# Patient Record
Sex: Female | Born: 2000 | Race: Black or African American | Hispanic: No | Marital: Single | Smoking: Never smoker
Health system: Southern US, Community
[De-identification: ages and names within clinical notes are randomized; demographics above are authoritative.]

## PROBLEM LIST (undated history)

## (undated) DIAGNOSIS — J45909 Unspecified asthma, uncomplicated: Secondary | ICD-10-CM

## (undated) HISTORY — PX: APPENDECTOMY: SHX54

---

## 2018-07-22 ENCOUNTER — Institutional Professional Consult (permissible substitution): Payer: Self-pay | Admitting: Pulmonary Disease

## 2018-08-04 ENCOUNTER — Emergency Department (HOSPITAL_COMMUNITY)
Admission: EM | Admit: 2018-08-04 | Discharge: 2018-08-05 | Disposition: A | Discharge status: Home / Self Care | Payer: Medicaid Other | Attending: Emergency Medicine | Admitting: Emergency Medicine

## 2018-08-04 DIAGNOSIS — Z79899 Other long term (current) drug therapy: Secondary | ICD-10-CM | POA: Diagnosis not present

## 2018-08-04 DIAGNOSIS — J45909 Unspecified asthma, uncomplicated: Secondary | ICD-10-CM | POA: Insufficient documentation

## 2018-08-04 DIAGNOSIS — R079 Chest pain, unspecified: Secondary | ICD-10-CM | POA: Diagnosis present

## 2018-08-04 DIAGNOSIS — R55 Syncope and collapse: Secondary | ICD-10-CM

## 2018-08-04 HISTORY — DX: Unspecified asthma, uncomplicated: J45.909

## 2018-08-05 ENCOUNTER — Encounter (HOSPITAL_COMMUNITY): Payer: Self-pay | Admitting: Emergency Medicine

## 2018-08-05 ENCOUNTER — Emergency Department (HOSPITAL_COMMUNITY): Payer: Medicaid Other

## 2018-08-05 LAB — I-STAT BETA HCG BLOOD, ED (MC, WL, AP ONLY)

## 2018-08-05 LAB — BASIC METABOLIC PANEL
Anion gap: 8 (ref 5–15)
BUN: 10 mg/dL (ref 6–20)
CHLORIDE: 106 mmol/L (ref 98–111)
CO2: 22 mmol/L (ref 22–32)
CREATININE: 0.72 mg/dL (ref 0.44–1.00)
Calcium: 8.9 mg/dL (ref 8.9–10.3)
Glucose, Bld: 85 mg/dL (ref 70–99)
POTASSIUM: 4 mmol/L (ref 3.5–5.1)
SODIUM: 136 mmol/L (ref 135–145)

## 2018-08-05 LAB — CBC
HEMATOCRIT: 32.9 % — AB (ref 36.0–46.0)
Hemoglobin: 9.7 g/dL — ABNORMAL LOW (ref 12.0–15.0)
MCH: 25.6 pg — ABNORMAL LOW (ref 26.0–34.0)
MCHC: 29.5 g/dL — ABNORMAL LOW (ref 30.0–36.0)
MCV: 86.8 fL (ref 78.0–100.0)
PLATELETS: 275 10*3/uL (ref 150–400)
RBC: 3.79 MIL/uL — AB (ref 3.87–5.11)
RDW: 15.4 % (ref 11.5–15.5)
WBC: 6 10*3/uL (ref 4.0–10.5)

## 2018-08-05 LAB — I-STAT TROPONIN, ED
Troponin i, poc: 0 ng/mL (ref 0.00–0.08)
Troponin i, poc: 0 ng/mL (ref 0.00–0.08)

## 2018-08-05 LAB — D-DIMER, QUANTITATIVE: D-Dimer, Quant: 0.4 ug/mL-FEU (ref 0.00–0.50)

## 2018-08-05 NOTE — ED Notes (Signed)
Patient verbalizes understanding of discharge instructions. Opportunity for questioning and answers were provided. Armband removed by staff, pt discharged from ED ambulatory.   

## 2018-08-05 NOTE — ED Triage Notes (Signed)
Patient reports central chest pain and syncopal episode while at marching band practice this evening , she added headache - hit her head against the ground when she fainted . Alert and oriented at arrival . Denies SOB , occasional dry cough .

## 2018-08-05 NOTE — ED Provider Notes (Signed)
[facility] EMERGENCY DEPARTMENT Provider Note   CSN: [id number] Arrival date & time: 08/04/18  2348     History   Chief Complaint Chief Complaint  Patient presents with  . Chest Pain    Syncope    HPI Danielle English is a 17 y.o. female.  The history is provided by the patient.  Chest Pain   This is a new problem. The current episode started 3 to 5 hours ago. The problem occurs constantly. The problem has not changed since onset.The pain is associated with walking. The pain is present in the substernal region. The pain is moderate. The quality of the pain is described as sharp. The pain does not radiate. Associated symptoms include syncope. Pertinent negatives include no abdominal pain, no diaphoresis, no fever, no headaches, no numbness, no palpitations, no shortness of breath, no sputum production, no vomiting and no weakness. She has tried nothing for the symptoms. The treatment provided no relief. There are no known risk factors.  Pertinent negatives for past medical history include no PE and no seizures.  Pertinent negatives for family medical history include: no Marfan's syndrome.  Procedure history is negative for cardiac catheterization.  Had not eaten or drank anything today.    Past Medical History:  Diagnosis Date  . Asthma     There are no active problems to display for this patient.   History reviewed. No pertinent surgical history.   OB History   None      Home Medications    Prior to Admission medications   Medication Sig Start Date End Date Taking? Authorizing Provider  albuterol (PROAIR HFA) 108 (90 Base) MCG/ACT inhaler Inhale 2 puffs into the lungs every 4 (four) hours as needed for wheezing.  04/26/17  Yes [provider]  ferrous sulfate 325 (65 FE) MG tablet Take 325 mg by mouth daily with breakfast.   Yes [provider]  JUNEL FE 1/20 1-20 MG-MCG tablet Take 1 tablet by mouth daily. 07/21/18  Yes [provider]  mometasone-formoterol (DULERA) 100-5 MCG/ACT AERO Inhale 2 puffs into the lungs 2 (two) times daily. 03/01/18  Yes [provider]  montelukast (SINGULAIR) 10 MG tablet Take 10 mg by mouth every evening. 03/11/17  Yes [provider]  QVAR REDIHALER 80 MCG/ACT inhaler Inhale 1-2 puffs into the lungs 2 (two) times daily.  07/30/18  Yes [provider]    Family History No family history on file.  Social History Social History   Tobacco Use  . Smoking status: Never Smoker  . Smokeless tobacco: Never Used  Substance Use Topics  . Alcohol use: Never    Frequency: Never  . Drug use: Never     Allergies   Patient has no known allergies.   Review of Systems Review of Systems  Constitutional: Negative for diaphoresis and fever.  Respiratory: Negative for sputum production, chest tightness, shortness of breath, wheezing and stridor.   Cardiovascular: Positive for chest pain and syncope. Negative for palpitations and leg swelling.  Gastrointestinal: Negative for abdominal pain and vomiting.  Neurological: Positive for syncope. Negative for tremors, seizures, facial asymmetry, speech difficulty, weakness, numbness and headaches.  All other systems reviewed and are negative.    Physical Exam Updated Vital Signs BP 116/76   Pulse 63   Temp 98 F (36.7 C) (Oral)   Resp 16   LMP 07/29/2018 (Approximate)   SpO2 100%   Physical Exam  Constitutional: She is oriented to person, place,  and time. She appears well-developed and well-nourished. No distress.  HENT:  Head: Normocephalic and atraumatic.  Mouth/Throat: No oropharyngeal exudate.  Eyes: Pupils are equal, round, and reactive to light. Conjunctivae are normal.  Neck: Normal range of motion. Neck supple.  Cardiovascular: Normal rate, regular rhythm, normal heart sounds and intact distal pulses.  Pulmonary/Chest: Effort normal and breath sounds normal. No stridor. She has no wheezes. She  has no rales.  Abdominal: Soft. Bowel sounds are normal. She exhibits no mass. There is no tenderness. There is no rebound and no guarding.  Musculoskeletal: Normal range of motion.  Neurological: She is alert and oriented to person, place, and time. She displays normal reflexes.  Skin: Skin is warm and dry. Capillary refill takes less than 2 seconds.  Psychiatric: She has a normal mood and affect.     ED Treatments / Results  Labs (all labs ordered are listed, but only abnormal results are displayed) Results for orders placed or performed during the hospital encounter of 08/04/18  Basic metabolic panel  Result Value Ref Range   Sodium 136 135 - 145 mmol/L   Potassium 4.0 3.5 - 5.1 mmol/L   Chloride 106 98 - 111 mmol/L   CO2 22 22 - 32 mmol/L   Glucose, Bld 85 70 - 99 mg/dL   BUN 10 6 - 20 mg/dL   Creatinine, Ser 9.60 0.44 - 1.00 mg/dL   Calcium 8.9 8.9 - 45.4 mg/dL   GFR calc non Af Amer >60 >60 mL/min   GFR calc Af Amer >60 >60 mL/min   Anion gap 8 5 - 15  CBC  Result Value Ref Range   WBC 6.0 4.0 - 10.5 K/uL   RBC 3.79 (L) 3.87 - 5.11 MIL/uL   Hemoglobin 9.7 (L) 12.0 - 15.0 g/dL   HCT 09.8 (L) 11.9 - 14.7 %   MCV 86.8 78.0 - 100.0 fL   MCH 25.6 (L) 26.0 - 34.0 pg   MCHC 29.5 (L) 30.0 - 36.0 g/dL   RDW 82.9 56.2 - 13.0 %   Platelets 275 150 - 400 K/uL  D-dimer, quantitative (not at [facility])  Result Value Ref Range   D-Dimer, Quant 0.40 0.00 - 0.50 ug/mL-FEU  I-stat troponin, ED  Result Value Ref Range   Troponin i, poc 0.00 0.00 - 0.08 ng/mL   Comment 3          I-Stat beta hCG blood, ED  Result Value Ref Range   I-stat hCG, quantitative <5.0 <5 mIU/mL   Comment 3          I-stat troponin, ED  Result Value Ref Range   Troponin i, poc 0.00 0.00 - 0.08 ng/mL   Comment 3           Dg Chest 2 View  Result Date: 08/05/2018 CLINICAL DATA:  Central chest pain and syncopal episode while at marching band practice. Fainted and struck head. EXAM: CHEST - 2 VIEW COMPARISON:   None. FINDINGS: The heart size and mediastinal contours are within normal limits. Both lungs are clear. The visualized skeletal structures are unremarkable. IMPRESSION: No active cardiopulmonary disease. Electronically Signed   By: Burman Nieves M.D.   On: 08/05/2018 00:46    EKG EKG Interpretation  Date/Time:  Thursday August 04 2018 23:54:10 EDT Ventricular Rate:  73 PR Interval:  210 QRS Duration: 68 QT Interval:  354 QTC Calculation: 389 R Axis:   53 Text Interpretation:  Sinus rhythm with 1st degree A-V block Otherwise normal  ECG No old tracing to compare Confirmed by Dione BoozeGlick, David ([id number]) on 08/04/2018 11:58:45 PM   Radiology Dg Chest 2 View  Result Date: 08/05/2018 CLINICAL DATA:  Central chest pain and syncopal episode while at marching band practice. Fainted and struck head. EXAM: CHEST - 2 VIEW COMPARISON:  None. FINDINGS: The heart size and mediastinal contours are within normal limits. Both lungs are clear. The visualized skeletal structures are unremarkable. IMPRESSION: No active cardiopulmonary disease. Electronically Signed   By: Burman NievesWilliam  Stevens M.D.   On: 08/05/2018 00:46    Procedures Procedures (including critical care time)   Final Clinical Impressions(s) / ED Diagnoses   No signs of Brugada or IHSS on EKG.   Suspect vasovagal episodes.  Strict return precautions given.    Return for fevers >100.4 unrelieved by medication, shortness of breath, intractable vomiting, or diarrhea, Inability to tolerate liquids or food, cough, altered mental status or any concerns. No signs of systemic illness or infection. The patient is nontoxic-appearing on exam and vital signs are within normal limits.   I have reviewed the triage vital signs and the nursing notes. Pertinent labs &imaging results that were available during my care of the patient were reviewed by me and considered in my medical decision making (see chart for details).  After history, exam, and medical  workup I feel the patient has been appropriately medically screened and is safe for discharge home. Pertinent diagnoses were discussed with the patient. Patient was given return precautions.      Chantelle Verdi, MD 08/05/18 [id number]

## 2020-02-01 ENCOUNTER — Ambulatory Visit: Payer: Medicaid Other | Attending: Internal Medicine

## 2020-02-02 ENCOUNTER — Emergency Department (HOSPITAL_COMMUNITY)
Admission: EM | Admit: 2020-02-02 | Discharge: 2020-02-03 | Disposition: A | Discharge status: Home / Self Care | Payer: Medicaid Other | Attending: Emergency Medicine | Admitting: Emergency Medicine

## 2020-02-02 ENCOUNTER — Encounter (HOSPITAL_COMMUNITY): Payer: Self-pay

## 2020-02-02 ENCOUNTER — Other Ambulatory Visit: Payer: Self-pay

## 2020-02-02 DIAGNOSIS — Z79899 Other long term (current) drug therapy: Secondary | ICD-10-CM | POA: Diagnosis not present

## 2020-02-02 DIAGNOSIS — J45909 Unspecified asthma, uncomplicated: Secondary | ICD-10-CM | POA: Insufficient documentation

## 2020-02-02 DIAGNOSIS — R112 Nausea with vomiting, unspecified: Secondary | ICD-10-CM | POA: Diagnosis not present

## 2020-02-02 DIAGNOSIS — R1012 Left upper quadrant pain: Secondary | ICD-10-CM | POA: Diagnosis not present

## 2020-02-02 DIAGNOSIS — R109 Unspecified abdominal pain: Secondary | ICD-10-CM | POA: Diagnosis present

## 2020-02-02 LAB — URINALYSIS, ROUTINE W REFLEX MICROSCOPIC
Bilirubin Urine: NEGATIVE
Glucose, UA: NEGATIVE mg/dL
Hgb urine dipstick: NEGATIVE
Ketones, ur: 20 mg/dL — AB
Nitrite: NEGATIVE
Protein, ur: NEGATIVE mg/dL
Specific Gravity, Urine: 1.021 (ref 1.005–1.030)
pH: 6 (ref 5.0–8.0)

## 2020-02-02 LAB — CBC
HCT: 39.3 % (ref 36.0–46.0)
Hemoglobin: 12.5 g/dL (ref 12.0–15.0)
MCH: 29.1 pg (ref 26.0–34.0)
MCHC: 31.8 g/dL (ref 30.0–36.0)
MCV: 91.4 fL (ref 80.0–100.0)
Platelets: 252 10*3/uL (ref 150–400)
RBC: 4.3 MIL/uL (ref 3.87–5.11)
RDW: 13.8 % (ref 11.5–15.5)
WBC: 5.5 10*3/uL (ref 4.0–10.5)
nRBC: 0 % (ref 0.0–0.2)

## 2020-02-02 LAB — COMPREHENSIVE METABOLIC PANEL
ALT: 14 U/L (ref 0–44)
AST: 16 U/L (ref 15–41)
Albumin: 4 g/dL (ref 3.5–5.0)
Alkaline Phosphatase: 58 U/L (ref 38–126)
Anion gap: 8 (ref 5–15)
BUN: 13 mg/dL (ref 6–20)
CO2: 22 mmol/L (ref 22–32)
Calcium: 9 mg/dL (ref 8.9–10.3)
Chloride: 106 mmol/L (ref 98–111)
Creatinine, Ser: 0.59 mg/dL (ref 0.44–1.00)
GFR calc Af Amer: >60 mL/min (ref >60)
GFR calc non Af Amer: >60 mL/min (ref >60)
Glucose, Bld: 81 mg/dL (ref 70–99)
Potassium: 3.5 mmol/L (ref 3.5–5.1)
Sodium: 136 mmol/L (ref 135–145)
Total Bilirubin: 1.1 mg/dL (ref 0.3–1.2)
Total Protein: 7.4 g/dL (ref 6.5–8.1)

## 2020-02-02 LAB — LIPASE, BLOOD: Lipase: 21 U/L (ref 11–51)

## 2020-02-02 LAB — I-STAT BETA HCG BLOOD, ED (MC, WL, AP ONLY): I-stat hCG, quantitative: <5 m[IU]/mL (ref <5)

## 2020-02-02 MED ORDER — ONDANSETRON HCL 4 MG/2ML IJ SOLN
4.0000 mg | Freq: Once | INTRAMUSCULAR | Status: AC
  Administered 2020-02-02: 4 mg via INTRAVENOUS
  Filled 2020-02-02: qty 2

## 2020-02-02 MED ORDER — FENTANYL CITRATE (PF) 100 MCG/2ML IJ SOLN
50.0000 ug | Freq: Once | INTRAMUSCULAR | Status: AC
  Administered 2020-02-02: 50 ug via INTRAVENOUS
  Filled 2020-02-02: qty 2

## 2020-02-02 MED ORDER — LIDOCAINE VISCOUS HCL 2 % MT SOLN
15.0000 mL | Freq: Once | OROMUCOSAL | Status: AC
  Administered 2020-02-02: 15 mL via ORAL

## 2020-02-02 MED ORDER — ALUM & MAG HYDROXIDE-SIMETH 200-200-20 MG/5ML PO SUSP
30.0000 mL | Freq: Once | ORAL | Status: AC
  Administered 2020-02-02: 30 mL via ORAL
  Filled 2020-02-02: qty 30

## 2020-02-02 MED ORDER — ONDANSETRON 4 MG PO TBDP: 4.0000 mg | ORAL_TABLET | Freq: Three times a day (TID) | ORAL | 0 refills | Status: AC | PRN

## 2020-02-02 MED ORDER — SODIUM CHLORIDE 0.9% FLUSH
3.0000 mL | Freq: Once | INTRAVENOUS | Status: AC
  Administered 2020-02-02: 3 mL via INTRAVENOUS

## 2020-02-02 MED ORDER — SODIUM CHLORIDE 0.9 % IV BOLUS
500.0000 mL | Freq: Once | INTRAVENOUS | Status: AC
  Administered 2020-02-02: 500 mL via INTRAVENOUS

## 2020-02-02 NOTE — ED Provider Notes (Signed)
[place] DEPT Provider Note   CSN: [id number] Arrival date & time: 02/02/20  1554     History Chief Complaint  Patient presents with  . Abdominal Pain    Danielle English is a 19 y.o. female.  HPI Patient presents with left upper abdominal pain.  Goes in the back.  Has had some nausea without vomiting.  Began around 11 AM this morning.  States she has not eaten anything.  No real appetite.  Also states when she tried to eat something the pain increased.  Has not had pain like this before.  No dysuria.  States she has had previous frequent urinary tract infections.  Denies vaginal discharge.  Denies dysuria.  Pain is constant.  Worse with certain positions also.  No fevers or chills    Past Medical History:  Diagnosis Date  . Asthma     There are no problems to display for this patient.   History reviewed. No pertinent surgical history.   OB History   No obstetric history on file.     Family History  Adopted: Yes    Social History   Tobacco Use  . Smoking status: Never Smoker  . Smokeless tobacco: Never Used  Substance Use Topics  . Alcohol use: Never  . Drug use: Never    Home Medications Prior to Admission medications   Medication Sig Start Date End Date Taking? Authorizing Provider  albuterol (PROAIR HFA) 108 (90 Base) MCG/ACT inhaler Inhale 2 puffs into the lungs every 4 (four) hours as needed for wheezing.  04/26/17  Yes [provider]  ferrous sulfate 325 (65 FE) MG tablet Take 325 mg by mouth daily with breakfast.   Yes [provider]  JUNEL FE 1/20 1-20 MG-MCG tablet Take 1 tablet by mouth daily. 07/21/18  Yes [provider]  mometasone-formoterol (DULERA) 100-5 MCG/ACT AERO Inhale 2 puffs into the lungs 2 (two) times daily. 03/01/18  Yes [provider]  montelukast (SINGULAIR) 10 MG tablet Take 10 mg by mouth every evening. 03/11/17  Yes [provider]  QVAR REDIHALER 80 MCG/ACT  inhaler Inhale 1-2 puffs into the lungs 2 (two) times daily.  07/30/18  Yes [provider]  ondansetron (ZOFRAN-ODT) 4 MG disintegrating tablet Take 1 tablet (4 mg total) by mouth every 8 (eight) hours as needed for nausea or vomiting. 02/02/20   Davonna Belling, MD    Allergies    Patient has no known allergies.  Review of Systems   Review of Systems  Constitutional: Positive for appetite change. Negative for fever.  HENT: Negative for congestion.   Respiratory: Negative for shortness of breath.   Cardiovascular: Negative for chest pain.  Gastrointestinal: Positive for abdominal pain and nausea. Negative for constipation, diarrhea and vomiting.  Genitourinary: Negative for flank pain.  Musculoskeletal: Negative for back pain.  Skin: Negative for rash.  Neurological: Negative for weakness.  Psychiatric/Behavioral: Negative for confusion.    Physical Exam Updated Vital Signs BP 121/73 (BP Location: Left Arm)   Pulse (!) 56   Temp 98.4 F (36.9 C) (Oral)   Resp 18   Ht 5\' 3"  (1.6 m)   Wt 68 kg   LMP 01/07/2020   SpO2 100%   BMI 26.57 kg/m   Physical Exam Vitals and nursing note reviewed.  Cardiovascular:     Rate and Rhythm: Normal rate and regular rhythm.  Pulmonary:     Breath sounds: Normal breath sounds.  Abdominal:     Tenderness:  There is abdominal tenderness.     Comments: Left upper quadrant tenderness without rebound or guarding.  No hernia palpated.  Genitourinary:    Comments: Mild CVA tenderness on left. Skin:    General: Skin is warm.     Capillary Refill: Capillary refill takes less than 2 seconds.  Neurological:     Mental Status: She is alert and oriented to person, place, and time.     ED Results / Procedures / Treatments   Labs (all labs ordered are listed, but only abnormal results are displayed) Labs Reviewed  URINALYSIS, ROUTINE W REFLEX MICROSCOPIC - Abnormal; Notable for the following components:      Result Value   Ketones,  ur 20 (*)    Leukocytes,Ua TRACE (*)    Bacteria, UA FEW (*)    All other components within normal limits  LIPASE, BLOOD  COMPREHENSIVE METABOLIC PANEL  CBC  I-STAT BETA HCG BLOOD, ED ([facility], [facility], [facility] ONLY)    EKG None  Radiology No results found.  Procedures Procedures (including critical care time)  Medications Ordered in ED Medications  sodium chloride flush (NS) 0.9 % injection 3 mL (3 mLs Intravenous Given 02/02/20 1927)  ondansetron (ZOFRAN) injection 4 mg (4 mg Intravenous Given 02/02/20 1925)  fentaNYL (SUBLIMAZE) injection 50 mcg (50 mcg Intravenous Given 02/02/20 1926)  sodium chloride 0.9 % bolus 500 mL (0 mLs Intravenous Stopped 02/02/20 2230)  alum & mag hydroxide-simeth (MAALOX/MYLANTA) 200-200-20 MG/5ML suspension 30 mL (30 mLs Oral Given 02/02/20 2229)    And  lidocaine (XYLOCAINE) 2 % viscous mouth solution 15 mL (15 mLs Oral Given 02/02/20 2230)    ED Course  I have reviewed the triage vital signs and the nursing notes.  Pertinent labs & imaging results that were available during my care of the patient were reviewed by me and considered in my medical decision making (see chart for details).    MDM Rules/Calculators/A&P                      Patient presents with nausea and abdominal pain.  Left upper quadrant.  Decreased appetite.  Lab work is very reassuring.  Feels somewhat better after GI cocktail and antiemetic.  Has tolerated some orals.  Benign abdominal exam without tenderness.  This point do not think we need imaging although if pain continues may need further work-up.  Will discharge home. Final Clinical Impression(s) / ED Diagnoses Final diagnoses:  Left upper quadrant abdominal pain  Nausea and vomiting, intractability of vomiting not specified, unspecified vomiting type    Rx / DC Orders ED Discharge Orders         Ordered    ondansetron (ZOFRAN-ODT) 4 MG disintegrating tablet  Every 8 hours PRN     02/02/20 2304           Benjiman Core,  MD 02/02/20 2311

## 2020-02-02 NOTE — ED Triage Notes (Addendum)
Per EMS- Patient c/o LUQ pain that radiates into the back that started this AM. Patient denies any N/v or urinary symptoms

## 2020-02-08 ENCOUNTER — Ambulatory Visit: Payer: Medicaid Other

## 2020-05-28 ENCOUNTER — Ambulatory Visit
Admission: EM | Admit: 2020-05-28 | Discharge: 2020-05-28 | Disposition: A | Discharge status: Home / Self Care | Payer: Medicaid Other | Attending: Emergency Medicine | Admitting: Emergency Medicine

## 2020-05-28 ENCOUNTER — Other Ambulatory Visit: Payer: Self-pay

## 2020-05-28 ENCOUNTER — Encounter: Payer: Self-pay | Admitting: Emergency Medicine

## 2020-05-28 DIAGNOSIS — N3001 Acute cystitis with hematuria: Secondary | ICD-10-CM | POA: Insufficient documentation

## 2020-05-28 LAB — POCT URINALYSIS DIP (MANUAL ENTRY)
Bilirubin, UA: NEGATIVE
Glucose, UA: NEGATIVE mg/dL
Ketones, POC UA: NEGATIVE mg/dL
Nitrite, UA: NEGATIVE
Protein Ur, POC: >=300 mg/dL — AB
Spec Grav, UA: >=1.03 — AB (ref 1.010–1.025)
Urobilinogen, UA: 1 E.U./dL
pH, UA: 6.5 (ref 5.0–8.0)

## 2020-05-28 LAB — POCT URINE PREGNANCY: Preg Test, Ur: NEGATIVE

## 2020-05-28 MED ORDER — CEPHALEXIN 500 MG PO CAPS: 500.0000 mg | ORAL_CAPSULE | Freq: Two times a day (BID) | ORAL | 0 refills | Status: AC

## 2020-05-28 MED ORDER — FLUCONAZOLE 200 MG PO TABS: 200.0000 mg | ORAL_TABLET | Freq: Once | ORAL | 0 refills | Status: AC

## 2020-05-28 NOTE — ED Provider Notes (Signed)
[name]    CSN: [id number] Arrival date & time: 05/28/20  1934      History   Chief Complaint Chief Complaint  Patient presents with  . Dysuria    HPI Danielle English is a 19 y.o. female with history of asthma presenting for 4-day course of foggy, malodorous urine, dysuria, chills, urinary frequency.  States she went to her student health center: No evidence of UTI there.  Patient states today she noticed blood clots coming out in her urine.  Patient denying pelvic or abdominal pain, back pain, fever.  No vaginal discharge.  LMP 05/24/2020.   Past Medical History:  Diagnosis Date  . Asthma     There are no problems to display for this patient.   History reviewed. No pertinent surgical history.  OB History   No obstetric history on file.      Home Medications    Prior to Admission medications   Medication Sig Start Date End Date Taking? Authorizing Provider  sertraline (ZOLOFT) 50 MG tablet Take 50 mg by mouth daily.   Yes [provider]  albuterol (PROAIR HFA) 108 (90 Base) MCG/ACT inhaler Inhale 2 puffs into the lungs every 4 (four) hours as needed for wheezing.  04/26/17   [provider]  cephALEXin (KEFLEX) 500 MG capsule Take 1 capsule (500 mg total) by mouth 2 (two) times daily for 3 days. 05/28/20 05/31/20  Hall-Potvin, [place], PA-C  ferrous sulfate 325 (65 FE) MG tablet Take 325 mg by mouth daily with breakfast.    [provider]  fluconazole (DIFLUCAN) 200 MG tablet Take 1 tablet (200 mg total) by mouth once for 1 dose. May repeat in 72 hours if needed 05/28/20 05/28/20  Hall-Potvin, [place], Cordelia Poche  JUNEL FE 1/20 1-20 MG-MCG tablet Take 1 tablet by mouth daily. 07/21/18   [provider]  mometasone-formoterol (DULERA) 100-5 MCG/ACT AERO Inhale 2 puffs into the lungs 2 (two) times daily. 03/01/18   [provider]  montelukast (SINGULAIR) 10 MG tablet Take 10 mg by mouth every evening. 03/11/17   [provider]  ondansetron (ZOFRAN-ODT) 4 MG disintegrating tablet Take 1 tablet (4 mg total) by mouth every 8 (eight) hours as needed for nausea or vomiting. 02/02/20   Benjiman Core, MD  QVAR REDIHALER 80 MCG/ACT inhaler Inhale 1-2 puffs into the lungs 2 (two) times daily.  07/30/18   [provider]    Family History Family History  Adopted: Yes  Problem Relation Age of Onset  . Healthy Mother   . Healthy Father     Social History Social History   Tobacco Use  . Smoking status: Never Smoker  . Smokeless tobacco: Never Used  Vaping Use  . Vaping Use: Never used  Substance Use Topics  . Alcohol use: Never  . Drug use: Never     Allergies   Patient has no known allergies.   Review of Systems As per HPI   Physical Exam Triage Vital Signs ED Triage Vitals  Enc Vitals Group     BP      Pulse      Resp      Temp      Temp src      SpO2      Weight      Height      Head Circumference      Peak Flow      Pain Score      Pain Loc  Pain Edu?      Excl. in GC?    No data found.  Updated Vital Signs BP 127/80 (BP Location: Left Arm)   Pulse 70   Temp 98.6 F (37 C) (Oral)   Resp 18   LMP 05/24/2020   SpO2 99%   Visual Acuity Right Eye Distance:   Left Eye Distance:   Bilateral Distance:    Right Eye Near:   Left Eye Near:    Bilateral Near:     Physical Exam Constitutional:      General: She is not in acute distress. HENT:     Head: Normocephalic and atraumatic.  Eyes:     General: No scleral icterus.    Pupils: Pupils are equal, round, and reactive to light.  Cardiovascular:     Rate and Rhythm: Normal rate.  Pulmonary:     Effort: Pulmonary effort is normal.  Abdominal:     General: Bowel sounds are normal.     Palpations: Abdomen is soft.     Tenderness: There is no abdominal tenderness. There is no right CVA tenderness, left CVA tenderness or guarding.  Skin:    Coloration: Skin is not jaundiced or pale.    Neurological:     Mental Status: She is alert and oriented to person, place, and time.      UC Treatments / Results  Labs (all labs ordered are listed, but only abnormal results are displayed) Labs Reviewed  POCT URINALYSIS DIP (MANUAL ENTRY) - Abnormal; Notable for the following components:      Result Value   Clarity, UA cloudy (*)    Spec Grav, UA >=1.030 (*)    Blood, UA large (*)    Protein Ur, POC >=300 (*)    Leukocytes, UA Small (1+) (*)    All other components within normal limits  URINE CULTURE  POCT URINE PREGNANCY    EKG   Radiology No results found.  Procedures Procedures (including critical care time)  Medications Ordered in UC Medications - No data to display  Initial Impression / Assessment and Plan / UC Course  I have reviewed the triage vital signs and the nursing notes.  Pertinent labs & imaging results that were available during my care of the patient were reviewed by me and considered in my medical decision making (see chart for details).     Patient afebrile, nontoxic in office today.  Urine pregnancy negative.  Urine dipstick done in office, reviewed by me: Significant for elevated specific gravity, blood, protein, leukocytes.  Urine culture pending.  H&P most consistent with acute cystitis with hematuria vs renal calculi.  Will start Keflex today.  Return precautions discussed, patient verbalized understanding and is agreeable to plan. Final Clinical Impressions(s) / UC Diagnoses   Final diagnoses:  Acute cystitis with hematuria     Discharge Instructions     Take antibiotic twice daily with food. Important to drink plenty of water throughout the day. Return for worsening urinary symptoms, blood in urine, abdominal or back pain, fever.    ED Prescriptions    Medication Sig Dispense Auth. Provider   cephALEXin (KEFLEX) 500 MG capsule Take 1 capsule (500 mg total) by mouth 2 (two) times daily for 3 days. 6 capsule Hall-Potvin,  [place], PA-C   fluconazole (DIFLUCAN) 200 MG tablet Take 1 tablet (200 mg total) by mouth once for 1 dose. May repeat in 72 hours if needed 2 tablet Hall-Potvin, [place], PA-C     PDMP not reviewed this  encounter.   Hall-Potvin, [place], [place] 05/28/20 2007

## 2020-05-28 NOTE — ED Triage Notes (Signed)
Pt presents to [facility] for assessment of 4 days of foggy urine, malodorous urine, discomfort with urination, chills, and urinary frequency.  Pt states she [facility] the student center and they did not show signs of urinary infection.  Patient states today she noted blood clots coming out in her urine.

## 2020-05-28 NOTE — Discharge Instructions (Signed)
Take antibiotic twice daily with food. Important to drink plenty of water throughout the day. Return for worsening urinary symptoms, blood in urine, abdominal or back pain, fever. 

## 2020-05-30 LAB — URINE CULTURE: Culture: <10000 — AB

## 2020-09-01 DIAGNOSIS — Z20822 Contact with and (suspected) exposure to covid-19: Secondary | ICD-10-CM | POA: Insufficient documentation

## 2020-09-01 DIAGNOSIS — J020 Streptococcal pharyngitis: Secondary | ICD-10-CM | POA: Insufficient documentation

## 2020-09-01 DIAGNOSIS — R0602 Shortness of breath: Secondary | ICD-10-CM | POA: Diagnosis present

## 2020-09-02 ENCOUNTER — Emergency Department (HOSPITAL_COMMUNITY): Payer: Medicaid Other

## 2020-09-02 ENCOUNTER — Encounter (HOSPITAL_COMMUNITY): Payer: Self-pay | Admitting: Emergency Medicine

## 2020-09-02 ENCOUNTER — Emergency Department (HOSPITAL_COMMUNITY)
Admission: EM | Admit: 2020-09-02 | Discharge: 2020-09-02 | Disposition: A | Discharge status: Home / Self Care | Payer: Medicaid Other | Attending: Emergency Medicine | Admitting: Emergency Medicine

## 2020-09-02 DIAGNOSIS — J020 Streptococcal pharyngitis: Secondary | ICD-10-CM

## 2020-09-02 LAB — BASIC METABOLIC PANEL
Anion gap: 12 (ref 5–15)
BUN: 9 mg/dL (ref 6–20)
CO2: 23 mmol/L (ref 22–32)
Calcium: 9 mg/dL (ref 8.9–10.3)
Chloride: 104 mmol/L (ref 98–111)
Creatinine, Ser: 0.68 mg/dL (ref 0.44–1.00)
GFR, Estimated: >60 mL/min (ref >60)
Glucose, Bld: 89 mg/dL (ref 70–99)
Potassium: 3.8 mmol/L (ref 3.5–5.1)
Sodium: 139 mmol/L (ref 135–145)

## 2020-09-02 LAB — I-STAT BETA HCG BLOOD, ED (MC, WL, AP ONLY): I-stat hCG, quantitative: <5 m[IU]/mL (ref <5)

## 2020-09-02 LAB — CBC
HCT: 36.5 % (ref 36.0–46.0)
Hemoglobin: 11.5 g/dL — ABNORMAL LOW (ref 12.0–15.0)
MCH: 28.2 pg (ref 26.0–34.0)
MCHC: 31.5 g/dL (ref 30.0–36.0)
MCV: 89.5 fL (ref 80.0–100.0)
Platelets: 296 10*3/uL (ref 150–400)
RBC: 4.08 MIL/uL (ref 3.87–5.11)
RDW: 14.6 % (ref 11.5–15.5)
WBC: 4.1 10*3/uL (ref 4.0–10.5)
nRBC: 0 % (ref 0.0–0.2)

## 2020-09-02 LAB — GROUP A STREP BY PCR: Group A Strep by PCR: DETECTED — AB

## 2020-09-02 LAB — RESPIRATORY PANEL BY RT PCR (FLU A&B, COVID)
Influenza A by PCR: NEGATIVE
Influenza B by PCR: NEGATIVE
SARS Coronavirus 2 by RT PCR: NEGATIVE

## 2020-09-02 MED ORDER — IBUPROFEN 200 MG PO TABS
600.0000 mg | ORAL_TABLET | Freq: Once | ORAL | Status: AC
  Administered 2020-09-02: 600 mg via ORAL
  Filled 2020-09-02: qty 3

## 2020-09-02 MED ORDER — PENICILLIN G BENZATHINE 1200000 UNIT/2ML IM SUSP
1.2000 10*6.[IU] | Freq: Once | INTRAMUSCULAR | Status: AC
  Administered 2020-09-02: 1.2 10*6.[IU] via INTRAMUSCULAR
  Filled 2020-09-02: qty 2

## 2020-09-02 MED ORDER — IPRATROPIUM-ALBUTEROL 0.5-2.5 (3) MG/3ML IN SOLN
3.0000 mL | Freq: Once | RESPIRATORY_TRACT | Status: AC
  Administered 2020-09-02: 3 mL via RESPIRATORY_TRACT
  Filled 2020-09-02: qty 3

## 2020-09-02 NOTE — ED Triage Notes (Signed)
Pt reports sore throat, cough, congestion, and post nasal drip since Friday. Denies fever or sick contacts. Endorses fatigue. She states that she has been taking her allergy medication without relief. Partially vaccinated against COVID19. Denies known sick contacts.

## 2020-09-02 NOTE — ED Provider Notes (Signed)
[facility]-EMERGENCY DEPT Provider Note   CSN: [id number] Arrival date & time: 09/01/20  2350     History Chief Complaint  Patient presents with  . Sore Throat  . Cough  . Nasal Congestion    Danielle English is a 19 y.o. female with a history of asthma who presents to the emergency department with a chief complaint of shortness of breath.  The patient reports that she has been having fatigue and generalized headaches for the last week. Then, 3 days ago she developed an intermittent sore throat that is worse in the morning and resolves throughout the day, postnasal drip, nasal congestion, and intermittently productive cough with yellow sputum, shortness of breath and chest tightness.  She was concerned that her symptoms might be an asthma exacerbation so she started using her albuterol inhaler, which she has administered approximately 4-5 times today. She has had some improvement with albuterol.  She has a history of previous admissions from asthma. No history of ICU admissions or intubation. She has been compliant with her home Qvar and Dulera. She is followed by an allergist and immunologist in [place].  She has had 2 weeks of vaginal bleeding. Reports that she had her normal menstrual cycle and then took the Plan B pill. After taking Plan B, she is continue to have vaginal bleeding. Abdominal bleeding has been constant, but has been waxing and waning in severity. She has been changing her tampon approximately 4-5 times daily.  She received the first dose of the Pfizer vaccine more than a month ago, but did not receive the second dose yet.   The history is provided by the patient. No language interpreter was used.       Past Medical History:  Diagnosis Date  . Asthma     There are no problems to display for this patient.   History reviewed. No pertinent surgical history.   OB History   No obstetric history on file.     Family History  Adopted: Yes   Problem Relation Age of Onset  . Healthy Mother   . Healthy Father     Social History   Tobacco Use  . Smoking status: Never Smoker  . Smokeless tobacco: Never Used  Vaping Use  . Vaping Use: Never used  Substance Use Topics  . Alcohol use: Never  . Drug use: Never    Home Medications Prior to Admission medications   Medication Sig Start Date End Date Taking? Authorizing Provider  albuterol (PROAIR HFA) 108 (90 Base) MCG/ACT inhaler Inhale 2 puffs into the lungs every 4 (four) hours as needed for wheezing.  04/26/17  Yes [provider]  JUNEL FE 1/20 1-20 MG-MCG tablet Take 1 tablet by mouth daily. 07/21/18  Yes [provider]  mometasone (NASONEX) 50 MCG/ACT nasal spray Place 2 sprays into the nose daily.   Yes [provider]  mometasone-formoterol (DULERA) 100-5 MCG/ACT AERO Inhale 2 puffs into the lungs 2 (two) times daily. 03/01/18  Yes [provider]  montelukast (SINGULAIR) 10 MG tablet Take 10 mg by mouth every evening. 03/11/17  Yes [provider]  ondansetron (ZOFRAN-ODT) 4 MG disintegrating tablet Take 1 tablet (4 mg total) by mouth every 8 (eight) hours as needed for nausea or vomiting. 02/02/20  Yes Benjiman Core, MD  QVAR REDIHALER 80 MCG/ACT inhaler Inhale 1-2 puffs into the lungs 2 (two) times daily.  07/30/18  Yes [provider]  sertraline (ZOLOFT) 50 MG tablet Take 50 mg  by mouth daily.   Yes [provider]    Allergies    Patient has no known allergies.  Review of Systems   Review of Systems  Physical Exam Updated Vital Signs BP 124/75 (BP Location: Left Arm)   Pulse (!) 54   Temp 98.9 F (37.2 C) (Oral)   Resp 16   Ht 5\' 3"  (1.6 m)   Wt 65.8 kg   LMP 08/23/2020 (Within Days) Comment: pt states she take birth control but she took a plan b and has had a longer period than normal  SpO2 100%   BMI 25.69 kg/m   Physical Exam Vitals and nursing note reviewed.  Constitutional:       General: She is not in acute distress.    Appearance: She is not ill-appearing or diaphoretic.     Comments: Well-appearing.  No acute distress.  HENT:     Head: Normocephalic.     Right Ear: Tympanic membrane, ear canal and external ear normal. There is no impacted cerumen.     Left Ear: Tympanic membrane, ear canal and external ear normal. There is no impacted cerumen.     Nose: Nose normal. No congestion or rhinorrhea.     Mouth/Throat:     Mouth: Mucous membranes are moist.     Pharynx: No oropharyngeal exudate or posterior oropharyngeal erythema.     Comments: Posterior oropharynx is erythematous.  No exudates noted.  Tonsillar edema is 2+ bilaterally.  Uvula is midline.  She is tolerating secretions without difficulty. Eyes:     Conjunctiva/sclera: Conjunctivae normal.  Cardiovascular:     Rate and Rhythm: Normal rate and regular rhythm.     Pulses: Normal pulses.     Heart sounds: Normal heart sounds. No murmur heard.  No friction rub. No gallop.   Pulmonary:     Effort: Pulmonary effort is normal. No respiratory distress.     Breath sounds: No stridor. No wheezing, rhonchi or rales.     Comments: Lung sounds are mildly diminished.  No adventitious breath sounds.  No increased work of breathing. Chest:     Chest wall: No tenderness.  Abdominal:     General: There is no distension.     Palpations: Abdomen is soft. There is no mass.     Tenderness: There is no abdominal tenderness. There is no right CVA tenderness, left CVA tenderness, guarding or rebound.     Hernia: No hernia is present.     Comments: Abdomen soft, nontender, nondistended.  Musculoskeletal:     Cervical back: Neck supple.     Right lower leg: No edema.     Left lower leg: No edema.  Skin:    General: Skin is warm.     Coloration: Skin is not jaundiced.     Findings: No rash.  Neurological:     Mental Status: She is alert.  Psychiatric:        Behavior: Behavior normal.     ED Results / Procedures  / Treatments   Labs (all labs ordered are listed, but only abnormal results are displayed) Labs Reviewed  GROUP A STREP BY PCR - Abnormal; Notable for the following components:      Result Value   Group A Strep by PCR DETECTED (*)    All other components within normal limits  CBC - Abnormal; Notable for the following components:   Hemoglobin 11.5 (*)    All other components within normal limits  RESPIRATORY PANEL BY RT  PCR (FLU A&B, COVID)  BASIC METABOLIC PANEL  I-STAT BETA HCG BLOOD, ED ([facility], [facility], [facility] ONLY)    EKG None  Radiology DG Chest Portable 1 View  Result Date: 09/02/2020 CLINICAL DATA:  19 year old female with shortness of breath, chest tightness. EXAM: PORTABLE CHEST 1 VIEW COMPARISON:  Chest radiographs 08/05/2018. FINDINGS: Portable AP semi upright view at 0332 hours. Lung volumes and mediastinal contours remain normal. Visualized tracheal air column is within normal limits. Allowing for portable technique the lungs are clear. No pneumothorax or pleural effusion. Incidental nipple piercings. Negative visible osseous structures and bowel gas pattern. IMPRESSION: Negative portable chest. Electronically Signed   By: Odessa Fleming M.D.   On: 09/02/2020 03:43    Procedures Procedures (including critical care time)  Medications Ordered in ED Medications  penicillin g benzathine (BICILLIN LA) 1200000 UNIT/2ML injection 1.2 Million Units (has no administration in time range)  ibuprofen (ADVIL) tablet 600 mg (600 mg Oral Given 09/02/20 0410)  ipratropium-albuterol (DUONEB) 0.5-2.5 (3) MG/3ML nebulizer solution 3 mL (3 mLs Nebulization Given 09/02/20 0410)    ED Course  I have reviewed the triage vital signs and the nursing notes.  Pertinent labs & imaging results that were available during my care of the patient were reviewed by me and considered in my medical decision making (see chart for details).    MDM Rules/Calculators/A&P                          19 year old female with  history of asthma who presents to the emergency department with fatigue, headaches for the last week accompanied by sore throat, nasal congestion, postnasal drip, productive cough, and shortness of breath over the last 3 days.  She also reports that she has been having vaginal bleeding for the last 2 weeks.  Vital signs are normal.  Patient is well-appearing and in no acute distress.  She has 2+ tonsillar edema bilaterally and posterior oropharynx is erythematous.  She is tolerating secretions without difficulty.  Lungs are minimally diminished, but she has no adventitious breath sounds.  Abdomen is benign.  Labs and imaging have been reviewed and independently interpreted by me.  Strep PCR is positive.  COVID-19 test is negative.  Chest x-ray is unremarkable.  Hemoglobin is 11.5, slightly decreased from previous, but stable, since she has been having vaginal bleeding for the last 2 weeks.  She does have a mild anemia, but I do not suspect this is the etiology of her symptoms.  No evidence of pneumonia.  Doubt Covid 19.  I suspect her symptoms are due to streptococcal pharyngitis.  Patient was agreeable to IM penicillin G in the ER.  Recommended follow-up with student health if vaginal bleeding does not improve in the next few weeks.  Supportive care has been discussed.  She is hemodynamically stable and in no acute distress.  ER return precautions given.  Safe for discharge home with outpatient follow-up as needed.  Final Clinical Impression(s) / ED Diagnoses Final diagnoses:  Streptococcal pharyngitis    Rx / DC Orders ED Discharge Orders    None       Barkley Boards, PA-C 09/02/20 0521    Palumbo, April, MD 09/02/20 [phone]

## 2020-09-02 NOTE — Discharge Instructions (Signed)
Thank you for allowing me to care for you today in the Emergency Department.   You tested positive for strep throat today.  You were treated with penicillin.  Take 650 mg of Tylenol or 600 mg of ibuprofen with food every 6 hours for pain.  You can alternate between these 2 medications every 3 hours if your pain returns.  For instance, you can take Tylenol at noon, followed by a dose of ibuprofen at 3, followed by second dose of Tylenol and 6.  NSAIDs are safe to take with asthma.  You can continue to use your albuterol inhaler if you develop shortness of breath or wheezing.  You did not have any wheezing on exam today.  Follow-up with student health if your vaginal bleeding does not significantly improve in the next 2 weeks.  You can also return to the emergency department for reevaluation if you develop significantly worse vaginal bleeding, including passing large clots, if you pass out, develop significant lightheadedness.  You should also return if your throat becomes very swollen, if you have difficulty breathing, if you become unable to swallow, have drooling, or other new, concerning symptoms.

## 2021-03-17 ENCOUNTER — Ambulatory Visit (HOSPITAL_COMMUNITY)
Admission: EM | Admit: 2021-03-17 | Discharge: 2021-03-17 | Disposition: A | Discharge status: Home / Self Care | Payer: Medicaid Other | Attending: Physician Assistant | Admitting: Physician Assistant

## 2021-03-17 ENCOUNTER — Other Ambulatory Visit: Payer: Self-pay

## 2021-03-17 ENCOUNTER — Encounter (HOSPITAL_COMMUNITY): Payer: Self-pay

## 2021-03-17 DIAGNOSIS — R102 Pelvic and perineal pain: Secondary | ICD-10-CM | POA: Diagnosis not present

## 2021-03-17 DIAGNOSIS — N939 Abnormal uterine and vaginal bleeding, unspecified: Secondary | ICD-10-CM | POA: Diagnosis not present

## 2021-03-17 LAB — POCT URINALYSIS DIPSTICK, ED / UC
Bilirubin Urine: NEGATIVE
Glucose, UA: NEGATIVE mg/dL
Ketones, ur: NEGATIVE mg/dL
Leukocytes,Ua: NEGATIVE
Nitrite: NEGATIVE
Protein, ur: NEGATIVE mg/dL
Specific Gravity, Urine: 1.02 (ref 1.005–1.030)
Urobilinogen, UA: 4 mg/dL — ABNORMAL HIGH (ref 0.0–1.0)
pH: 7.5 (ref 5.0–8.0)

## 2021-03-17 LAB — POC URINE PREG, ED: Preg Test, Ur: NEGATIVE

## 2021-03-17 NOTE — ED Triage Notes (Signed)
Pt presents with complaints of vaginal bleeding that started after intercourse last night. Reports bleeding has gotten heavier over the day along with lower abdominal pain.

## 2021-03-17 NOTE — Discharge Instructions (Signed)
Urine pregnancy test was negative.  It appears that you are bleeding is from the cervix which could be another menstrual cycle due to missing doses of your medication.  If you have heavy bleeding please go to the ER as we discussed.  Follow-up with OB/GYN.

## 2021-03-17 NOTE — ED Provider Notes (Signed)
[facility]    CSN: [id number] Arrival date & time: 03/17/21  1949      History   Chief Complaint Chief Complaint  Patient presents with  . Vaginal Bleeding    HPI Danielle English is a 20 y.o. female.   Patient presents today with a several hour history of vaginal bleeding following intercourse.  She reports some pelvic pressure with associated back pain.  She denies any pain during intercourse.  She has any abdominal pain, nausea, vomiting, fever.  She denies any significant vaginal discharge but does report some pruritus.  Denies any changes to condoms or lubricants.  She has no concern for STI but is open to testing today.  She is on OCP and does report missing some doses of medication.  Reports LMP approximately 2 weeks ago.  She reports pain is rated 5 on a 0-10 pain scale, localized to pelvis without radiation, described as cramping, no aggravating alleviating factors identified.  She has not tried any over-the-counter medications for symptom management.  Denies history of bleeding disorder and takes no blood thinning medications.  Denies any chest pain or shortness of breath.     Past Medical History:  Diagnosis Date  . Asthma     There are no problems to display for this patient.   History reviewed. No pertinent surgical history.  OB History   No obstetric history on file.      Home Medications    Prior to Admission medications   Medication Sig Start Date End Date Taking? Authorizing Provider  albuterol (PROAIR HFA) 108 (90 Base) MCG/ACT inhaler Inhale 2 puffs into the lungs every 4 (four) hours as needed for wheezing.  04/26/17   [provider]  JUNEL FE 1/20 1-20 MG-MCG tablet Take 1 tablet by mouth daily. 07/21/18   [provider]  mometasone (NASONEX) 50 MCG/ACT nasal spray Place 2 sprays into the nose daily.    [provider]  mometasone-formoterol (DULERA) 100-5 MCG/ACT AERO Inhale 2 puffs into the lungs 2 (two) times  daily. 03/01/18   [provider]  montelukast (SINGULAIR) 10 MG tablet Take 10 mg by mouth every evening. 03/11/17   [provider]  ondansetron (ZOFRAN-ODT) 4 MG disintegrating tablet Take 1 tablet (4 mg total) by mouth every 8 (eight) hours as needed for nausea or vomiting. 02/02/20   Benjiman Core, MD  QVAR REDIHALER 80 MCG/ACT inhaler Inhale 1-2 puffs into the lungs 2 (two) times daily.  07/30/18   [provider]  sertraline (ZOLOFT) 50 MG tablet Take 50 mg by mouth daily.    [provider]    Family History Family History  Adopted: Yes  Problem Relation Age of Onset  . Healthy Mother   . Healthy Father     Social History Social History   Tobacco Use  . Smoking status: Never Smoker  . Smokeless tobacco: Never Used  Vaping Use  . Vaping Use: Never used  Substance Use Topics  . Alcohol use: Never  . Drug use: Never     Allergies   Patient has no known allergies.   Review of Systems Review of Systems  Constitutional: Negative for activity change, appetite change, fatigue and fever.  Respiratory: Negative for cough and shortness of breath.   Cardiovascular: Negative for chest pain.  Gastrointestinal: Negative for abdominal pain, diarrhea, nausea and vomiting.  Genitourinary: Positive for pelvic pain, vaginal bleeding and vaginal pain. Negative for dyspareunia, dysuria, frequency and vaginal discharge.  Musculoskeletal: Positive  for back pain. Negative for arthralgias and myalgias.  Neurological: Negative for dizziness, light-headedness and headaches.     Physical Exam Triage Vital Signs ED Triage Vitals  Enc Vitals Group     BP 03/17/21 2016 121/70     Pulse Rate 03/17/21 2016 66     Resp 03/17/21 2016 19     Temp 03/17/21 2016 98 F (36.7 C)     Temp src --      SpO2 03/17/21 2016 98 %     Weight --      Height --      Head Circumference --      Peak Flow --      Pain Score 03/17/21 2015 5     Pain Loc --      Pain  Edu? --      Excl. in GC? --    No data found.  Updated Vital Signs BP 121/70   Pulse 66   Temp 98 F (36.7 C)   Resp 19   LMP 03/03/2021   SpO2 98%   Visual Acuity Right Eye Distance:   Left Eye Distance:   Bilateral Distance:    Right Eye Near:   Left Eye Near:    Bilateral Near:     Physical Exam Vitals reviewed. Exam conducted with a chaperone present.  Constitutional:      General: She is awake. She is not in acute distress.    Appearance: Normal appearance. She is not ill-appearing.     Comments: Very pleasant female appears stated age no acute distress  HENT:     Head: Normocephalic and atraumatic.  Cardiovascular:     Rate and Rhythm: Normal rate and regular rhythm.     Heart sounds: No murmur heard.   Pulmonary:     Effort: Pulmonary effort is normal.     Breath sounds: Normal breath sounds. No wheezing, rhonchi or rales.     Comments: Clear to auscultation bilaterally Abdominal:     General: Bowel sounds are normal.     Palpations: Abdomen is soft.     Tenderness: There is no abdominal tenderness. There is no right CVA tenderness, left CVA tenderness, guarding or rebound.     Comments: I  Genitourinary:    Labia:        Right: No rash or tenderness.        Left: No rash or tenderness.      Vagina: No signs of injury and foreign body.     Cervix: Cervical bleeding present. No cervical motion tenderness.     Uterus: Normal.      Adnexa: Right adnexa normal and left adnexa normal.       Right: No mass or tenderness.         Left: No mass or tenderness.       Comments: Toni Amend RN present as chaperone during exam.  Blood noted in posterior vaginal vault and cervical os.  No discharge noted.  No laceration or injury noted.  No adnexal tenderness on exam. Psychiatric:        Behavior: Behavior is cooperative.      UC Treatments / Results  Labs (all labs ordered are listed, but only abnormal results are displayed) Labs Reviewed  POCT URINALYSIS  DIPSTICK, ED / UC - Abnormal; Notable for the following components:      Result Value   Hgb urine dipstick MODERATE (*)    Urobilinogen, UA 4.0 (*)    All other  components within normal limits  POC URINE PREG, ED  CERVICOVAGINAL ANCILLARY ONLY    EKG   Radiology No results found.  Procedures Procedures (including critical care time)  Medications Ordered in UC Medications - No data to display  Initial Impression / Assessment and Plan / UC Course  I have reviewed the triage vital signs and the nursing notes.  Pertinent labs & imaging results that were available during my care of the patient were reviewed by me and considered in my medical decision making (see chart for details).     Suspect symptoms are related to abnormal uterine bleeding related to missing doses of OCP.  No significant bright red blood or laceration noted on exam.  Urine pregnancy was negative.  UA showed hemoglobin as expected without additional evidence of infection.  Patient does not have a OB/GYN but was given contact information for OB/GYN to follow-up tomorrow should symptoms persist.  Discussed that if she has significant bleeding including having to change personal hygiene products more than once every 2 hours for more than 8 to 12 hours as she needs to go to the emergency room for further evaluation.  She was instructed to abstain from intercourse until evaluated by OB/GYN and symptoms improved.  STI testing completed today-results pending.  Will defer treatment until results are obtained.  Strict return precautions given to which patient expressed understanding.  Final Clinical Impressions(s) / UC Diagnoses   Final diagnoses:  Vaginal bleeding  Pelvic pain     Discharge Instructions     Urine pregnancy test was negative.  It appears that you are bleeding is from the cervix which could be another menstrual cycle due to missing doses of your medication.  If you have heavy bleeding please go to the ER  as we discussed.  Follow-up with OB/GYN.    ED Prescriptions    None     PDMP not reviewed this encounter.   Jeani Hawking, Cordelia Poche 03/17/21 2118

## 2021-03-18 LAB — CERVICOVAGINAL ANCILLARY ONLY
Bacterial Vaginitis (gardnerella): NEGATIVE
Candida Glabrata: NEGATIVE
Candida Vaginitis: NEGATIVE
Chlamydia: NEGATIVE
Comment: NEGATIVE
Comment: NEGATIVE
Comment: NEGATIVE
Comment: NEGATIVE
Comment: NEGATIVE
Comment: NORMAL
Neisseria Gonorrhea: NEGATIVE
Trichomonas: NEGATIVE

## 2021-06-23 ENCOUNTER — Encounter (HOSPITAL_COMMUNITY): Payer: Self-pay | Admitting: Emergency Medicine

## 2021-06-23 ENCOUNTER — Emergency Department (HOSPITAL_COMMUNITY)
Admission: EM | Admit: 2021-06-23 | Discharge: 2021-06-23 | Disposition: A | Discharge status: Home / Self Care | Payer: Medicaid Other | Attending: Emergency Medicine | Admitting: Emergency Medicine

## 2021-06-23 DIAGNOSIS — J45909 Unspecified asthma, uncomplicated: Secondary | ICD-10-CM | POA: Diagnosis not present

## 2021-06-23 DIAGNOSIS — Z7952 Long term (current) use of systemic steroids: Secondary | ICD-10-CM | POA: Insufficient documentation

## 2021-06-23 DIAGNOSIS — F332 Major depressive disorder, recurrent severe without psychotic features: Secondary | ICD-10-CM | POA: Diagnosis not present

## 2021-06-23 DIAGNOSIS — Z20822 Contact with and (suspected) exposure to covid-19: Secondary | ICD-10-CM | POA: Insufficient documentation

## 2021-06-23 DIAGNOSIS — R45851 Suicidal ideations: Secondary | ICD-10-CM | POA: Diagnosis not present

## 2021-06-23 DIAGNOSIS — F411 Generalized anxiety disorder: Secondary | ICD-10-CM | POA: Insufficient documentation

## 2021-06-23 LAB — CBC WITH DIFFERENTIAL/PLATELET
Abs Immature Granulocytes: 0.01 10*3/uL (ref 0.00–0.07)
Basophils Absolute: 0 10*3/uL (ref 0.0–0.1)
Basophils Relative: 0 %
Eosinophils Absolute: 0.1 10*3/uL (ref 0.0–0.5)
Eosinophils Relative: 1 %
HCT: 32.1 % — ABNORMAL LOW (ref 36.0–46.0)
Hemoglobin: 10.1 g/dL — ABNORMAL LOW (ref 12.0–15.0)
Immature Granulocytes: 0 %
Lymphocytes Relative: 36 %
Lymphs Abs: 2.5 10*3/uL (ref 0.7–4.0)
MCH: 28.7 pg (ref 26.0–34.0)
MCHC: 31.5 g/dL (ref 30.0–36.0)
MCV: 91.2 fL (ref 80.0–100.0)
Monocytes Absolute: 0.9 10*3/uL (ref 0.1–1.0)
Monocytes Relative: 14 %
Neutro Abs: 3.4 10*3/uL (ref 1.7–7.7)
Neutrophils Relative %: 49 %
Platelets: 288 10*3/uL (ref 150–400)
RBC: 3.52 MIL/uL — ABNORMAL LOW (ref 3.87–5.11)
RDW: 14.5 % (ref 11.5–15.5)
WBC: 6.9 10*3/uL (ref 4.0–10.5)
nRBC: 0 % (ref 0.0–0.2)

## 2021-06-23 LAB — COMPREHENSIVE METABOLIC PANEL
ALT: 17 U/L (ref 0–44)
AST: 20 U/L (ref 15–41)
Albumin: 3.8 g/dL (ref 3.5–5.0)
Alkaline Phosphatase: 42 U/L (ref 38–126)
Anion gap: 7 (ref 5–15)
BUN: 11 mg/dL (ref 6–20)
CO2: 23 mmol/L (ref 22–32)
Calcium: 8.9 mg/dL (ref 8.9–10.3)
Chloride: 106 mmol/L (ref 98–111)
Creatinine, Ser: 0.7 mg/dL (ref 0.44–1.00)
GFR, Estimated: >60 mL/min (ref >60)
Glucose, Bld: 88 mg/dL (ref 70–99)
Potassium: 3.6 mmol/L (ref 3.5–5.1)
Sodium: 136 mmol/L (ref 135–145)
Total Bilirubin: 0.9 mg/dL (ref 0.3–1.2)
Total Protein: 7 g/dL (ref 6.5–8.1)

## 2021-06-23 LAB — I-STAT BETA HCG BLOOD, ED (MC, WL, AP ONLY): I-stat hCG, quantitative: <5 m[IU]/mL (ref <5)

## 2021-06-23 LAB — RESP PANEL BY RT-PCR (FLU A&B, COVID) ARPGX2
Influenza A by PCR: NEGATIVE
Influenza B by PCR: NEGATIVE
SARS Coronavirus 2 by RT PCR: NEGATIVE

## 2021-06-23 LAB — SALICYLATE LEVEL: Salicylate Lvl: <7 mg/dL — ABNORMAL LOW (ref 7.0–30.0)

## 2021-06-23 LAB — RAPID URINE DRUG SCREEN, HOSP PERFORMED
Amphetamines: NOT DETECTED
Barbiturates: NOT DETECTED
Benzodiazepines: NOT DETECTED
Cocaine: NOT DETECTED
Opiates: NOT DETECTED
Tetrahydrocannabinol: POSITIVE — AB

## 2021-06-23 LAB — ACETAMINOPHEN LEVEL: Acetaminophen (Tylenol), Serum: <10 ug/mL — ABNORMAL LOW (ref 10–30)

## 2021-06-23 LAB — ETHANOL: Alcohol, Ethyl (B): <10 mg/dL (ref <10)

## 2021-06-23 MED ORDER — HYDROXYZINE HCL 25 MG PO TABS: 25.0000 mg | ORAL_TABLET | Freq: Three times a day (TID) | ORAL | Status: DC | PRN

## 2021-06-23 NOTE — ED Notes (Signed)
Lt Green and purple drawn sent to lab, dark green placed on mini-lab rocker, awaiting order for processing.

## 2021-06-23 NOTE — ED Notes (Addendum)
Patient is psych cleared and ready for discharge. Patient used phone to call for ride. Patient provided with new blue scrubs as her clothes were wet.   Reviewed disposition and follow up with appointment today that is already scheduled.   Patient reports she has a friend picking her up. Patient refuses bus pass and states she does not like to ride the bus.

## 2021-06-23 NOTE — ED Notes (Signed)
Pt comfortable and resting at this time, no current concerns or complaints.

## 2021-06-23 NOTE — Discharge Instructions (Addendum)
For your behavior health needs you are advised to follow up with an outpatient therapist.  Contact one of the following providers at your earliest opportunity to schedule an intake appointment:       [facility]      [address]      [place], [place] [address]      [phone]      New patients are seen at their walk-in clinic.  Walk-in hours are Monday - Friday from 8:30 am - 12:00 pm, and from 1:00 pm - 2:30 pm.  Walk-in patients are seen on a first come, first served basis, so try to arrive as early as possible for the best chance of being seen the same day.       [facility]      [address] [place], [place] [address]      [phone]

## 2021-06-23 NOTE — BH Assessment (Signed)
[facility] Assessment Progress Note   Per Dorena Bodo, NP, this voluntary pt does not require psychiatric hospitalization at this time.  Pt is psychiatrically cleared.  Discharge instructions include referral information for area therapy providers.  EDP Pieter Partridge, MD and pt's nurse, Whitney Post, have been notified.  Doylene Canning, MA Triage Specialist [phone]
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
## 2021-06-23 NOTE — ED Provider Notes (Signed)
Patient was cleared by behavioral health and will be discharged home with outpatient resources.   Koleen Distance, MD 06/23/21 1140

## 2021-06-23 NOTE — ED Notes (Signed)
Patient given soda. Patient calm and pleasant.

## 2021-06-23 NOTE — ED Provider Notes (Signed)
[facility]-EMERGENCY DEPT Provider Note   CSN: [id number] Arrival date & time: 06/23/21  0150     History Chief Complaint  Patient presents with   Suicidal    Danielle English is a 20 y.o. female.  20 year old female presents to the emergency department for suicidal ideations x1 year.  Her suicidal ideations were worse tonight, but have been worsening overall since March.  Expresses plans to overdose on ibuprofen.  Denies any ingestion prior to arrival.  No alcohol or illicit drug use.  Has been experiencing a lot of life stressors.  Was previously on Zoloft, but has not had a refill in over a month.  Is not presently followed consistently by any therapist/counselor or psychiatrist.  No hx of BH hospitalizations.  The history is provided by the patient. No language interpreter was used.      Past Medical History:  Diagnosis Date   Asthma     There are no problems to display for this patient.   History reviewed. No pertinent surgical history.   OB History   No obstetric history on file.     Family History  Adopted: Yes  Problem Relation Age of Onset   Healthy Mother    Healthy Father     Social History   Tobacco Use   Smoking status: Never   Smokeless tobacco: Never  Vaping Use   Vaping Use: Never used  Substance Use Topics   Alcohol use: Never   Drug use: Never    Home Medications Prior to Admission medications   Medication Sig Start Date End Date Taking? Authorizing Provider  albuterol (PROAIR HFA) 108 (90 Base) MCG/ACT inhaler Inhale 2 puffs into the lungs every 4 (four) hours as needed for wheezing.  04/26/17   [provider]  JUNEL FE 1/20 1-20 MG-MCG tablet Take 1 tablet by mouth daily. 07/21/18   [provider]  mometasone (NASONEX) 50 MCG/ACT nasal spray Place 2 sprays into the nose daily.    [provider]  mometasone-formoterol (DULERA) 100-5 MCG/ACT AERO Inhale 2 puffs into the lungs 2 (two) times daily.  03/01/18   [provider]  montelukast (SINGULAIR) 10 MG tablet Take 10 mg by mouth every evening. 03/11/17   [provider]  ondansetron (ZOFRAN-ODT) 4 MG disintegrating tablet Take 1 tablet (4 mg total) by mouth every 8 (eight) hours as needed for nausea or vomiting. 02/02/20   Benjiman Core, MD  QVAR REDIHALER 80 MCG/ACT inhaler Inhale 1-2 puffs into the lungs 2 (two) times daily.  07/30/18   [provider]  sertraline (ZOLOFT) 50 MG tablet Take 50 mg by mouth daily.    [provider]    Allergies    Patient has no known allergies.  Review of Systems   Review of Systems Ten systems reviewed and are negative for acute change, except as noted in the HPI.    Physical Exam Updated Vital Signs BP 118/81 (BP Location: Left Arm)   Pulse 66   Temp 98 F (36.7 C) (Oral)   Resp 18   Ht 5' 3.5" (1.613 m)   Wt 67.1 kg   SpO2 100%   BMI 25.81 kg/m   Physical Exam Vitals and nursing note reviewed.  Constitutional:      General: She is not in acute distress.    Appearance: She is well-developed. She is not diaphoretic.  HENT:     Head: Normocephalic and atraumatic.  Eyes:     General: No scleral  icterus.    Conjunctiva/sclera: Conjunctivae normal.  Pulmonary:     Effort: Pulmonary effort is normal. No respiratory distress.  Musculoskeletal:        General: Normal range of motion.     Cervical back: Normal range of motion.  Skin:    General: Skin is warm and dry.     Coloration: Skin is not pale.     Findings: No erythema or rash.  Neurological:     Mental Status: She is alert and oriented to person, place, and time.  Psychiatric:        Mood and Affect: Mood is anxious and depressed.        Speech: Speech normal.        Behavior: Behavior is withdrawn.        Thought Content: Thought content includes suicidal ideation. Thought content does not include homicidal ideation. Thought content includes suicidal plan.    ED Results /  Procedures / Treatments   Labs (all labs ordered are listed, but only abnormal results are displayed) Labs Reviewed  CBC WITH DIFFERENTIAL/PLATELET - Abnormal; Notable for the following components:      Result Value   RBC 3.52 (*)    Hemoglobin 10.1 (*)    HCT 32.1 (*)    All other components within normal limits  ACETAMINOPHEN LEVEL - Abnormal; Notable for the following components:   Acetaminophen (Tylenol), Serum <10 (*)    All other components within normal limits  SALICYLATE LEVEL - Abnormal; Notable for the following components:   Salicylate Lvl <7.0 (*)    All other components within normal limits  RESP PANEL BY RT-PCR (FLU A&B, COVID) ARPGX2  COMPREHENSIVE METABOLIC PANEL  ETHANOL  RAPID URINE DRUG SCREEN, HOSP PERFORMED  I-STAT BETA HCG BLOOD, ED ([facility], [facility], [facility] ONLY)    EKG None  Radiology No results found.  Procedures Procedures   Medications Ordered in ED Medications  hydrOXYzine (ATARAX/VISTARIL) tablet 25 mg (has no administration in time range)    ED Course  I have reviewed the triage vital signs and the nursing notes.  Pertinent labs & imaging results that were available during my care of the patient were reviewed by me and considered in my medical decision making (see chart for details).    MDM Rules/Calculators/A&P                           20 year old female presenting for suicidal ideations.  Has a history of depression and anxiety.  Was previously on Zoloft.  Expresses plan to overdose on ibuprofen.  Denies any ingestion prior to arrival.  She has been medically cleared and is pending TTS assessment for recommendations with disposition.  Care to be assumed by oncoming ED provider.   Final Clinical Impression(s) / ED Diagnoses Final diagnoses:  Suicidal thoughts    Rx / DC Orders ED Discharge Orders     None        Antony Madura, PA-C 06/23/21 8416    Nira Conn, MD 06/23/21 2002

## 2021-06-23 NOTE — ED Notes (Signed)
Pt wanded by security. Belongings placed in a bag behind triage desk. Pt given warm blanket, pillow, water, and crackers.

## 2021-06-23 NOTE — ED Notes (Signed)
Dark green tube sent to lab after iSTAT completed.

## 2021-06-23 NOTE — ED Triage Notes (Signed)
Pt c/o SI thoughts x 1 year that became worse tonight. Plan was to overdose on ibuprofen. Pt reports a lot of life stressors.

## 2021-12-03 ENCOUNTER — Ambulatory Visit: Payer: Medicaid Other | Admitting: Radiation Oncology

## 2021-12-04 ENCOUNTER — Ambulatory Visit: Payer: Medicaid Other

## 2021-12-05 ENCOUNTER — Ambulatory Visit: Payer: Medicaid Other

## 2021-12-08 ENCOUNTER — Ambulatory Visit: Payer: Medicaid Other

## 2021-12-09 ENCOUNTER — Ambulatory Visit: Payer: Medicaid Other

## 2021-12-10 ENCOUNTER — Ambulatory Visit: Payer: Medicaid Other

## 2021-12-11 ENCOUNTER — Ambulatory Visit: Payer: Medicaid Other

## 2021-12-12 ENCOUNTER — Ambulatory Visit: Payer: Medicaid Other

## 2021-12-15 ENCOUNTER — Ambulatory Visit: Payer: Medicaid Other

## 2021-12-16 ENCOUNTER — Ambulatory Visit: Payer: Medicaid Other

## 2021-12-17 ENCOUNTER — Ambulatory Visit: Payer: Medicaid Other

## 2021-12-18 ENCOUNTER — Ambulatory Visit: Payer: Medicaid Other

## 2021-12-19 ENCOUNTER — Ambulatory Visit: Payer: Medicaid Other

## 2021-12-22 ENCOUNTER — Ambulatory Visit: Payer: Medicaid Other

## 2021-12-23 ENCOUNTER — Emergency Department (HOSPITAL_COMMUNITY)
Admission: EM | Admit: 2021-12-23 | Discharge: 2021-12-24 | Discharge status: Against Medical Advice | Payer: Medicaid Other | Attending: Emergency Medicine | Admitting: Emergency Medicine

## 2021-12-23 ENCOUNTER — Ambulatory Visit: Payer: Medicaid Other

## 2021-12-23 ENCOUNTER — Emergency Department (HOSPITAL_COMMUNITY): Payer: Medicaid Other

## 2021-12-23 DIAGNOSIS — Z5321 Procedure and treatment not carried out due to patient leaving prior to being seen by health care provider: Secondary | ICD-10-CM | POA: Diagnosis present

## 2021-12-23 DIAGNOSIS — J029 Acute pharyngitis, unspecified: Secondary | ICD-10-CM | POA: Diagnosis not present

## 2021-12-23 DIAGNOSIS — R0989 Other specified symptoms and signs involving the circulatory and respiratory systems: Secondary | ICD-10-CM | POA: Diagnosis not present

## 2021-12-23 NOTE — ED Triage Notes (Signed)
Pt states she was eating fish sandwich approx 1hr ago, fish bone lodged in throat. 1 episode of emesis "helped move it," endorses associated CP since event. Airway intact, speaking in triage no difficulty.

## 2021-12-23 NOTE — ED Provider Triage Note (Signed)
Emergency Medicine Provider Triage Evaluation Note  Danielle English , a 21 y.o. female  was evaluated in triage.  Pt complains of retained foreign body in esophagus.  Patient reports that 30 minutes ago she was eating a fish sandwich and believes that she swallowed a bone.  Patient states that she was able to, improvement in her symptoms however still is having esophageal pain.  Patient states that she is having drooling and pain with swallowing.  Review of Systems  Positive: Drooling, sore throat Negative: Neck stiffness, neck pain  Physical Exam  BP 134/70 (BP Location: Right Arm)    Pulse 90    Temp 98.5 F (36.9 C) (Oral)    Resp 16    SpO2 98%  Gen:   Awake, no distress   Resp:  Normal effort  MSK:   Moves extremities without difficulty  Other:  Oropharynx clear without swelling, erythema, or visible warm body.  Patient handles oral secretions without difficulty.  Patient speaks in full complete senses without difficulty.  Medical Decision Making  Medically screening exam initiated at 4:21 PM.  Appropriate orders placed.  Danielle English was informed that the remainder of the evaluation will be completed by another provider, this initial triage assessment does not replace that evaluation, and the importance of remaining in the ED until their evaluation is complete.     Haskel Schroeder, [place] 12/23/21 1623

## 2021-12-24 ENCOUNTER — Ambulatory Visit: Payer: Medicaid Other

## 2021-12-24 NOTE — ED Notes (Signed)
Patient called x 2 with no response. 

## 2021-12-25 ENCOUNTER — Ambulatory Visit: Payer: Medicaid Other

## 2021-12-26 ENCOUNTER — Ambulatory Visit: Payer: Medicaid Other

## 2021-12-29 ENCOUNTER — Ambulatory Visit: Payer: Medicaid Other

## 2021-12-30 ENCOUNTER — Ambulatory Visit: Payer: Medicaid Other

## 2021-12-31 ENCOUNTER — Ambulatory Visit: Payer: Medicaid Other

## 2022-01-01 ENCOUNTER — Ambulatory Visit: Payer: Medicaid Other

## 2022-01-02 ENCOUNTER — Ambulatory Visit: Payer: Medicaid Other

## 2022-01-05 ENCOUNTER — Ambulatory Visit: Payer: Medicaid Other

## 2022-01-06 ENCOUNTER — Ambulatory Visit: Payer: Medicaid Other

## 2022-01-07 ENCOUNTER — Ambulatory Visit: Payer: Medicaid Other

## 2022-01-08 ENCOUNTER — Ambulatory Visit: Payer: Medicaid Other

## 2022-01-09 ENCOUNTER — Ambulatory Visit: Payer: Medicaid Other

## 2022-01-12 ENCOUNTER — Ambulatory Visit: Payer: Medicaid Other

## 2022-01-13 ENCOUNTER — Ambulatory Visit: Payer: Medicaid Other

## 2022-01-14 ENCOUNTER — Ambulatory Visit: Payer: Medicaid Other

## 2022-01-15 ENCOUNTER — Ambulatory Visit: Payer: Medicaid Other

## 2022-01-16 ENCOUNTER — Ambulatory Visit: Payer: Medicaid Other

## 2022-01-19 ENCOUNTER — Ambulatory Visit: Payer: Medicaid Other

## 2022-01-20 ENCOUNTER — Ambulatory Visit: Payer: Medicaid Other

## 2023-07-04 IMAGING — CR DG NECK SOFT TISSUE
2 series · 2 of 2 positions shown · non-contrast
Comparison: None.

CLINICAL DATA: Concern for swallowed fish bone, emesis

EXAM:
NECK SOFT TISSUES - 1+ VIEW

[neck lat]
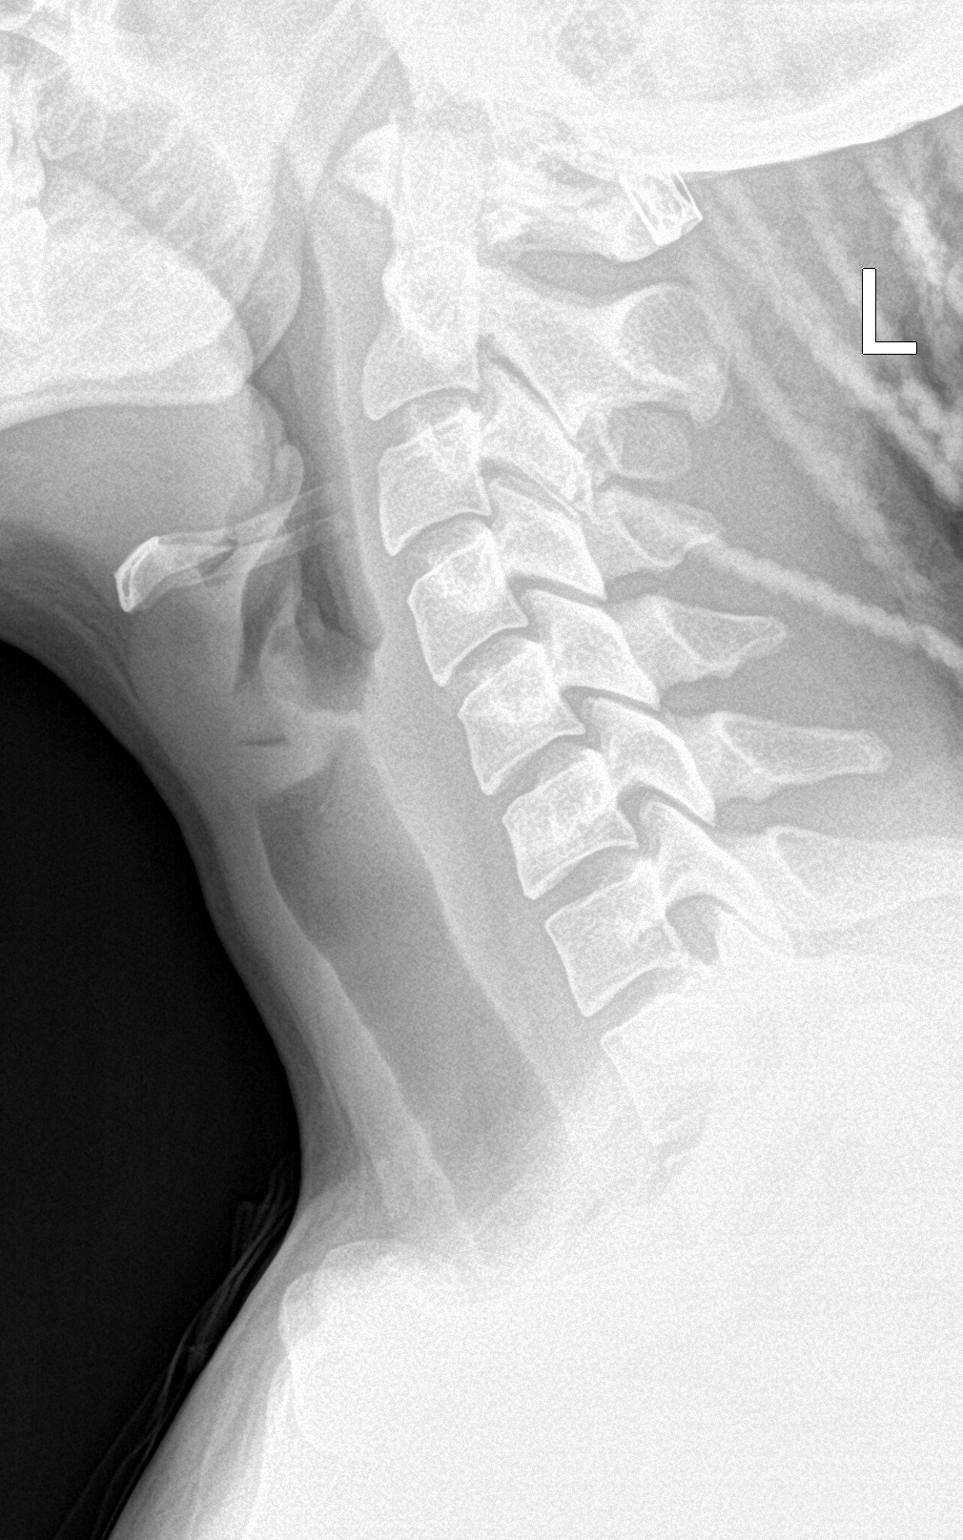

[neck ap]
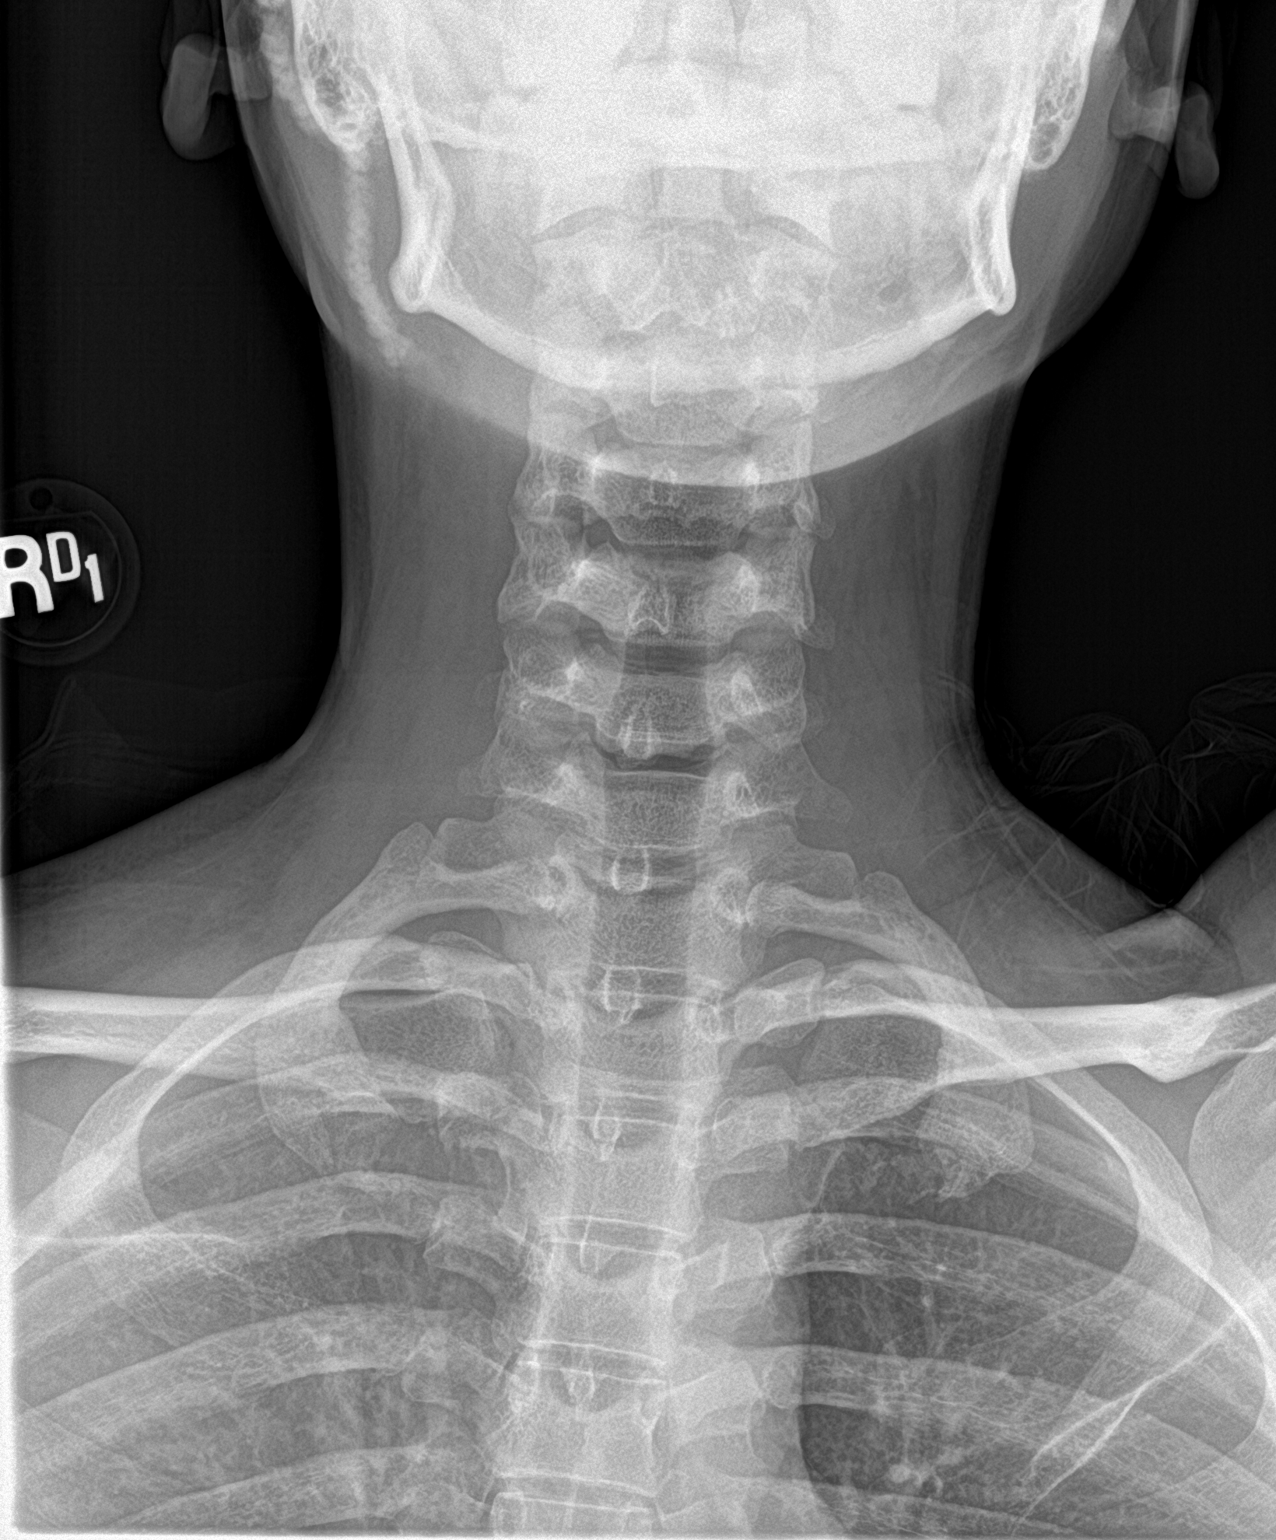

[2 of 2 positions shown; findings below may reference images not displayed]

FINDINGS: Frontal and lateral views of the soft tissues of the neck are
obtained. Prevertebral and retropharyngeal soft tissues are
unremarkable. The airways widely patent. Epiglottis is normal. I do
not see any radiopaque foreign bodies. Specifically, no evidence of
retained fishbone. Lung apices are clear. No acute bony
abnormalities.
IMPRESSION: 1. No evidence of radiopaque foreign body or retained fishbone.
2. Unremarkable soft tissues of the neck.

## 2024-01-28 NOTE — ED Provider Notes (Signed)
 [facility] Emergency Department Note  Chief Complaint:  Panic Attack (C/o panic attack pt. Is crying and hyperventilating in triage and initially was unable to answer questions but later states that she has had extra stress since yesterday and has been feeling anxious since then. Pt. States that she denies SI but states that she is so stressed that she just don't want to exist right now. Later during triage when she was calming down she was complaining of chest pain.)    History of Present Illness    Danielle English is a 23 y.o. female    History of Present Illness The patient presents for evaluation of a panic attack.  She experienced a panic attack today, which she attributes to a lack of sleep and inadequate nutrition over the past week. The onset of the attack was around 1:00 PM, coinciding with an attempt to contact her father. She has been under significant stress due to financial responsibilities towards her family and work commitments. Despite attempts to seek support from her friend and mother, she felt ignored and isolated. She resides with a friend and has a history of similar episodes, although the last one occurred several years ago following her father's demise. She has a history of generalized anxiety disorder and depressive disorder, for which she underwent a 4-month therapy in 2023. However, she discontinued the therapy as she felt it was not beneficial. She was previously prescribed Cymbalta 30 mg by her therapist. She has a history of psychiatric hospitalization in 2023 due to suicidal ideation and has attempted self-harm in the past. However, she reports no current thoughts of self-harm or intent to harm herself.  Supplemental Information She also has a history of asthma and endometriosis.  MEDICATIONS Past: Cymbalta   Review of Systems   A complete 10 point review of system was performed and is negative with the exception of those items previously documented in  the HPI and nursing notes. As is my standard practice,  all positives from the ROS are documented in the HPI  Physical Exam   BP 123/74 (BP Location: Right arm, Patient Position: Sitting)   Pulse 61   Temp 99.1 F (37.3 C) (Temporal)   Resp 18   Ht 160 cm (5' 3)   Wt 71.2 kg (157 lb)   SpO2 99%   BMI 27.81 kg/m    General: Alert, no acute distress Skin: Warm, dry, intact Eye: Normal conjunctiva EENT: Cardiovascular: Sinus tachycardia, normal peripheral perfusion, no edema Respiratory: Lungs clear to auscultation, nonlabored respirations, breath sounds equal, symmetrical expansion Gastrointestinal: Soft, bowel sounds times  four normal, nontender, nondistended MSK: No deformity Neuro: Awake and alert.  Moving all extremities equally Psych: Anxious.  Depressed mood.  Tearful.  Procedures   Procedures    ED Course & Medical Decision Making  Medical Decision Making:   23 year old female who presents as above. Initially patient had a panic attack.  Given benzos.  Labs and imaging.  Discussed and patient has requested to be evaluated by psychiatry given her worsening depression and symptoms   Patient signed out to overnight physician pending psychiatric evaluation The patient was evaluated in the ED for acute life-threatening emergencies.  No serious emergent etiologies were evident at this time other than those indicated.  The patient is non-toxic.  Exam and workup is currently consistent with diagnostic impression.   Differentials/Tests/Treatment options considered: Broad including ACS, acute disarray, panic attack, psychiatric disorder  Additional information obtained from review of MEDICAL RECORD NUMBERPrior  records reviewed  History source aside from patient: none  Social factors affecting medical care: none  Medications Ordered and Administered in ED: Medications  ALPRAZolam (XANAX) tablet 1 mg (1 mg oral Given 01/28/24 1608)      Reassessment:  ED Course  as of 02/05/24 1757  Fri Jan 28, 2024  2120 CBC with Differential:   WBC 5.24  Monocyte Distribution Width 18.2  RBC 3.98  Hemoglobin 11.0  Hematocrit 34  Mean Corpuscular Volume (MCV) 86  MCH 28  Mean Corpuscular Hemoglobin Conc (MCHC) 32  Red Cell Distribution Width (RDW-CV) 15.5  Platelet Count (Plt) 245  Mean Platelet Volume (MPV) 9.3  Neutrophils % 51  Lymphocytes % 32  Monocytes % 15  Eosinophils % 1  Basophils % 1  nRBC % 0  Neutrophils Absolute 2.70  Lymphocytes Absolute 1.70  Monocytes Absolute 0.80  Eosinophils Absolute 0.10  Basophils Absolute 0.00  nRBC Absolute 0.01 CBC with no leukocytosis or anemia [AH]  2121 Comprehensive Metabolic Panel(!):   Sodium 136  Potassium 4.2  Chloride 105  CO2 20(!)  Anion Gap 11  Glucose 80  Blood Urea Nitrogen 8  Creatinine 0.61  eGFR >90  Albumin 4.3  Total Protein 7.7  Bilirubin Total 0.7  Alk Phos Total 42  Aspartate Aminotransferase (AST) 24  Alanine Aminotransferase 16  Calcium Level Total 8.9 CMP with normal sodium potassium anion gap [AH]  2121 Human Chorionic Gonadotropin  (HCG), Qualitative, Urine:   Beta HCG Qualitative, Urine Negative hCG negative [AH]  2121 Patient with intact judgment.  Denies SI and HI. No indication for involuntary hold.  Discussed disposition with patient and would like to remain emergency department until evaluated by psychiatry [AH]    ED Course User Index [AH] Liza Norwood Roca, MD      Clinical Impression & Disposition   Problems Addressed:  1. Panic attack   2. Depressed mood       PATIENT REFERRED TO: Rosina Sharlyn Pouch, MD [address] [place] [address] [phone]  Schedule an appointment as soon as possible for a visit in 3 days Primary care follow-up   DISCHARGE MEDICATIONS: Discharge Medication List as of 01/29/2024  1:11 AM      DISCONTINUED MEDICATIONS: Discharge Medication List as of 01/29/2024  1:11 AM            Past Contributory History  Reviewed from chart  Allergies: House dust mite, Bee pollen, and Grass pollen   Personal History: Past Medical History:  Diagnosis Date  . Anxiety 06/2020  . Asthma    Asthma  . Depression 06/2020  . Eczema   . Gonorrhea 2020  . Herpes 2022  . Lightheadedness    Lightheadedness  . Mild asthma    Mild asthma  . Nausea    Nausea  . PID (pelvic inflammatory disease) 2022  . Trauma   . Urinary incontinence    URINARY INCONTINENCE, UNSPECIFIED  . Urinary tract infection without hematuria 08/21/2022    Past Surgical History:  Procedure Laterality Date  . APPENDECTOMY N/A 12/24/2023   APPENDECTOMY LAPAROSCOPIC performed by Kellie Irven Schmitt, MD at [facility] OR   Family History  Adopted: Yes  Problem Relation Name Age of Onset  . Diabetes Paternal Grandfather Tylisha Danis Sr   . Hypertension Paternal Grandfather Ermalee Mealy Sr   . Diabetes Paternal Grandmother Ruby Pechacek   . Hyperlipidemia Paternal Grandmother [employer]   . Hypertension Paternal Grandmother [employer]   . Stroke Paternal Grandmother  Ruby Deist   . Diabetes Father Carlin Claire        Adoptive father  . Hyperlipidemia Father Carlin Whidbee   . Hypertension Father Carlin Elkhatib   . Kidney disease Father Carlin Burchfield   . Breast cancer Neg Hx    . Colon cancer Neg Hx    . Ovarian cancer Neg Hx      Social History   Tobacco Use  . Smoking status: Never    Passive exposure: Never  . Smokeless tobacco: Never  Vaping Use  . Vaping status: Never Used  Substance Use Topics  . Alcohol use: Not Currently    Comment: occasional alcohol use, reports a few drinks per month  . Drug use: Yes    Types: Marijuana    Comment: Smoking    Home Medications: Current Outpatient Medications  Medication Instructions  . albuterol  HFA (PROVENTIL  HFA;VENTOLIN  HFA;PROAIR  HFA) 90 mcg/actuation inhaler 2 puffs, inhalation, Every 4 hours PRN  . beclomethasone dipropionate (QVAR) 80 mcg, inhalation, 2  times daily RT  . diphenhydrAMINE (BENADRYL) 25 mg, oral, 2 times daily  . loratadine (CLARITIN) 10 mg, Daily  . mometasone-formoterol (DULERA) 100-5 mcg/actuation HFAA inhaler 2 puffs, inhalation, 2 times daily RT  . omeprazole (PRILOSEC) 20 mg, oral, Daily  . oxyCODONE (ROXICODONE) 5 mg, oral, Every 6 hours PRN  . sennosides-docusate sodium (PERICOLACE) 8.6-50 mg per tablet 1 tablet, oral, Daily  . Xulane 150-35 mcg/24 hr ptwk 1 each, transdermal, Weekly     Results  Labs: Labs Reviewed  COMPREHENSIVE METABOLIC PANEL - Abnormal      Result Value   Sodium 136     Potassium 4.2     Chloride 105     CO2 20 (*)    Anion Gap 11     Glucose, Random 80     Blood Urea Nitrogen (BUN) 8     Creatinine 0.61     eGFR >90     Albumin 4.3     Total Protein 7.7     Bilirubin, Total 0.7     Alkaline Phosphatase (ALP) 42     Aspartate Aminotransferase (AST) 24     Alanine Aminotransferase (ALT) 16     Calcium 8.9     BUN/Creatinine Ratio      URINALYSIS WITH REFLEX TO MICROSCOPIC - Abnormal   Color, Urine Yellow     Clarity, Urine Clear     Specific Gravity, Urine 1.015     pH, Urine 7.5     Protein, Urine Negative     Glucose, Urine Negative     Ketones, Urine 15 (*)    Bilirubin, Urine Negative     Blood, Urine Negative     Nitrite, Urine Negative     Leukocyte Esterase, Urine Small (*)    Urobilinogen, Urine 1.0    DRUG SCREEN, 6 PANEL, URINE - Abnormal   Amphetamines Screen, Urine Negative     Barbiturates Screen, Urine Negative     Benzodiazepines Screen, Urine Negative     Cocaine Screen, Urine Negative     Opiates Screen, Urine Negative     Marijuana (THC) Screen, Urine Presumptive Positive (*)   URINALYSIS, MICROSCOPIC ONLY - Abnormal   WBC, Urine 6-10 (*)    RBC, Urine 0-2     Squamous Epithelial Cells, Urine 21-30 (*)    Bacteria, Urine Few (*)   AH SARS-COV-2, NUCLEIC ACID TEST - Normal   SARS-CoV-2, Nucleic Acid Test - Rapid Not Detected  Narrative:    This  test is an automated, real time RT-PCR assay for the rapid in vitro qualitative detection of SARS-CoV-2 viral RNA on the [employer] System. Results should not be used as the sole basis for treatment or other patient management decisions. Clinical correlation is required.    TROPONIN, HIGH SENSITIVE - Normal   Troponin, High Sensitive <6     Troponin, High Sensitive Interpretation Normal     Narrative:    The testing method is an immunoassay manufactured by [employer] performed on [employer].  Values obtained with different assay methods may be different and cannot be used interchangeably. High Sensitive Troponin specificity:  Troponin I  HUMAN CHORIONIC GONADOTROPIN  (HCG), QUALITATIVE, URINE - Normal   Beta HCG Qualitative, Urine Negative    ETHANOL - Normal   Ethanol <10    CBC WITH DIFFERENTIAL   WBC 5.24     Monocyte Distribution Width 18.2     RBC 3.98     Hemoglobin 11.0     Hematocrit 34     Mean Corpuscular Volume (MCV) 86     Mean Corpuscular Hemoglobin (MCH) 28     Mean Corpuscular Hemoglobin Conc (MCHC) 32     Red Cell Distribution Width (RDW) 15.5     Platelet Count (PLT) 245     Mean Platelet Volume (MPV) 9.3     Neutrophils % 51     Lymphocytes % 32     Monocytes % 15     Eosinophils % 1     Basophils % 1     nRBC % 0     Neutrophils Absolute 2.70     Lymphocytes # 1.70     Monocytes # 0.80     Eosinophils # 0.10     Basophils # 0.00     nRBC Absolute 0.01      Imaging: No orders to display     EKG: Encounter Date: 01/28/24  ECG 12 lead   Narrative                    [facility]                                                                                Test Date     2024-01-28 [facility] Name      [facility] [place]              Department    PINESCED Patient ID    [id number]               Room          YT97 Gender        Female                   Technician    Sq DOB           09/25/2001               Requested  By  VICTORY OVERLY Order Number  [id number]                Reading MD  Liza Roca                                  Measurements Intervals                              Axis           Rate          95                       P             36 PR            201                      QRS           36 QRSD          73                       T             48 QT            345                                     QTc           398                                                                Interpretive Statements SINUS RHYTHM WITH RATE OF 95 NORMAL AXIS NO STEMI I have personally reviewed the tracing and my findings are listed above Liza Roca on 01-28-2024 21 33 11 EDT     ED Meds Given: Medications  ALPRAZolam (XANAX) tablet 1 mg (1 mg oral Given 01/28/24 1608)     Clinical Decision Support:    CHA2DS2-VASc Score: N/A                                           Liza Blossom [facility]  Department of Emergency Medicine   Time Seen     Event Date/Time   First Provider Evaluation 01/28/24 1537

## 2024-02-13 NOTE — ED Provider Notes (Signed)
 [facility] Emergency Department Provider Note   Chief Complaint: Medical Clearance (Pt c/o a hole in the left side of her nostril. Unsure how it happened. States that when she has nasal drip it burns. Denies bleeding. )   Time Seen     Event Date/Time   First Provider Evaluation 02/13/24 1652         History of Present Illness   Danielle English is a 23 y.o. female presents with a couple days of wound/hole in her left nare that she noticed while looking in the mirror.  She states that she has seasonal allergies and has been blowing her nose and rubbing her nose frequently but has never noticed a hole in her nasal cavity.  Minimal discomfort.  No bleeding.  No reported fevers.  No other abnormal symptoms aside from allergic rhinitis related symptoms.  She does not describe the use of snorted/inhaled drugs or piercings, she just wanted an evaluation.     Review of Systems   Review of Systems   Past Contributory History   Allergies: House dust mite, Bee pollen, and Grass pollen   Personal History: Past Medical History:  Diagnosis Date  . Anxiety 06/2020  . Asthma    Asthma  . Depression 06/2020  . Eczema   . Gonorrhea 2020  . Herpes 2022  . Lightheadedness    Lightheadedness  . Mild asthma    Mild asthma  . Nausea    Nausea  . PID (pelvic inflammatory disease) 2022  . Trauma   . Urinary incontinence    URINARY INCONTINENCE, UNSPECIFIED  . Urinary tract infection without hematuria 08/21/2022    Past Surgical History:  Procedure Laterality Date  . APPENDECTOMY N/A 12/24/2023   APPENDECTOMY LAPAROSCOPIC performed by Kellie Irven Schmitt, MD at [facility] OR   Family History  Adopted: Yes  Problem Relation Name Age of Onset  . Diabetes Paternal Grandfather Adie Vilar Sr   . Hypertension Paternal Grandfather Briawna Carver Sr   . Diabetes Paternal Grandmother Ruby Wildrick   . Hyperlipidemia Paternal Grandmother [employer]   . Hypertension Paternal  Grandmother [employer]   . Stroke Paternal Grandmother Ruby Heiser   . Diabetes Father Carlin Scholle        Adoptive father  . Hyperlipidemia Father Carlin Sitton   . Hypertension Father Carlin [place]   . English disease Father Carlin Bayly   . Breast cancer Neg Hx    . Colon cancer Neg Hx    . Ovarian cancer Neg Hx      Social History   Tobacco Use  . Smoking status: Never    Passive exposure: Never  . Smokeless tobacco: Never  Vaping Use  . Vaping status: Never Used  Substance Use Topics  . Alcohol use: Not Currently    Comment: occasional alcohol use, reports a few drinks per month  . Drug use: Yes    Types: Marijuana    Comment: Smoking    Home Medications: Current Outpatient Medications  Medication Instructions  . albuterol  HFA (PROVENTIL  HFA;VENTOLIN  HFA;PROAIR  HFA) 90 mcg/actuation inhaler 2 puffs, inhalation, Every 4 hours PRN  . beclomethasone dipropionate (QVAR) 80 mcg, inhalation, 2 times daily RT  . diphenhydrAMINE (BENADRYL) 25 mg, oral, 2 times daily  . loratadine (CLARITIN) 10 mg, Daily  . mometasone-formoterol (DULERA) 100-5 mcg/actuation HFAA inhaler 2 puffs, inhalation, 2 times daily RT  . omeprazole (PRILOSEC) 20 mg, oral, Daily  . oxyCODONE (ROXICODONE) 5 mg, oral, Every 6 hours PRN  .  sennosides-docusate sodium (PERICOLACE) 8.6-50 mg per tablet 1 tablet, oral, Daily  . Xulane 150-35 mcg/24 hr ptwk 1 each, transdermal, Weekly     Physical Exam   Initial Vital Signs for the past 24 hrs (Last 1 readings):  BP Temp Temp src Pulse Resp SpO2 Height Weight  02/13/24 1641 116/69 97.4 F (36.3 C) Oral 78 18 99 % 160 cm (5' 3) 70.6 kg (155 lb 10.3 oz)   Physical Exam  ED Triage Vitals [02/13/24 1641]  Temp 97.4 F (36.3 C)  Heart Rate 78  Resp 18  BP 116/69  MAP (mmHg)   SpO2 99 %  O2 Device None (Room air)  O2 Flow Rate (L/min)   Weight 70.6 kg (155 lb 10.3 oz)   Physical Exam Vitals reviewed.  Constitutional:      General: She is not in acute  distress.    Appearance: Normal appearance. She is not ill-appearing, toxic-appearing or diaphoretic.  HENT:     Nose: Congestion present.     Comments: Just inside the left nare near the floor of the nasal cavity towards the inflection at the septum there appears to be a couple millimeter in diameter skin depression/wound that does not have any surrounding erythema, swelling, purulence, bleeding or other abnormalities.  Nasal septum, turbinates and remainder of nasal cavity appears normal aside from hyperemia and discharge Pulmonary:     Effort: Pulmonary effort is normal. No respiratory distress.  Neurological:     Mental Status: She is alert and oriented to person, place, and time.       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    Sq DOB           Aug 30, 2001               Requested By  VICTORY OVERLY Order Number  [id number]                Reading MD    Liza Roca                                  Measurements Intervals  Axis           Rate          95                       P             36 PR            201                      QRS           36 QRSD          73                       T             48 QT            345                                     QTc           398                                                                Interpretive Statements SINUS RHYTHM WITH RATE OF 95 NORMAL AXIS NO STEMI I have personally reviewed the tracing and my findings are listed above Liza Roca on 01-28-2024 21 33 11  EDT    Clinical Decision Support:     Procedures  ED Course & Medical Decision Making   Medical Decision Making: Medical Decision Making Problems Addressed: Nose pain: acute illness or injury  Nonspecific small mucosal/skin defect in the left nasal cavity that may be from mild trauma and likely may be multifactorial considering her concomitant allergic rhinitis.  HEENT exam otherwise unremarkable aside from presentation consistent with allergic rhinitis.  Do not suspect abscess, serious infection warranting antibiotics or other dedicated therapies.  Afebrile, unremarkable vital signs otherwise.  Appropriate respirations and no breathing abnormalities otherwise.  Discussed supportive care measures such as Vaseline and minimizing trauma.  Do not suspect she requires further imaging or emergent consultation with ENT in the immediate setting.  Discussed outpatient follow-up See HPI/PE/ED course for additional MDM components   ED Course:    ED Meds: Medications - No data to display   Diagnosis & Disposition   Diagnosis: 1. Nose pain      Disposition:  Discharge   Prescriptions: ED Prescriptions   None            Penne Danas [facility] Emergency Providers

## 2024-03-25 NOTE — ED Provider Notes (Signed)
 [facility] Emergency Department Provider Note     Chief Complaint: Flank Pain (Pt notes the onset of R flank pain and cramps in her uterus since Tuesday of this week. + urinary frequency, - blood in urine. )   Time Seen     Event Date/Time   First Provider Evaluation 03/25/24 1720          History of Present Illness   Danielle English is a 23 y.o. female with a past medical history of asthma, MDD, who presents to the emergency department for evaluation of abdominal pain.  Patient said that she has had lower abdominal cramping for around 1 week.  She describes the current cramping as similar nature to uterine cramping, has been under evaluation for possible endometriosis for the same.  She also notes that around 6 days ago she had unprotected intercourse and subsequently has developed some vaginal itching with some discharge, has concern for STI.  She notes that a couple weeks ago she had some left flank pain but that resolved.  She also notes that she has some mild increased urinary frequency denies any dysuria hematuria.  Denies any vaginal bleeding, diarrhea constipation nausea or vomiting upper abdominal pain fevers headaches or dizziness cough or congestion chest pain or shortness of breath.  HPI HPI    Review of Systems   A complete 10+ review of systems was negative unless noted in the HPI  Physical Exam   Initial Vital Signs for the past 24 hrs (Last 1 readings):  BP Temp Temp src Pulse Resp SpO2 Height Weight  03/25/24 1716 120/81 98.2 F (36.8 C) Oral 83 17 100 % 161.3 cm (5' 3.5) 72.6 kg (160 lb 0.9 oz)   Physical Exam  ED Triage Vitals [03/25/24 1716]  Temp 98.2 F (36.8 C)  Heart Rate 83  Resp 17  BP 120/81  MAP (mmHg)   SpO2 100 %  O2 Device None (Room air)  O2 Flow Rate (L/min)   Weight 72.6 kg (160 lb 0.9 oz)   Physical Exam Vitals and nursing note reviewed. Exam conducted with a chaperone present.  Constitutional:      General: She is not in  acute distress.    Appearance: Normal appearance. She is not ill-appearing, toxic-appearing or diaphoretic.  HENT:     Head: Normocephalic.     Right Ear: External ear normal.     Left Ear: External ear normal.     Nose: Nose normal.     Mouth/Throat:     Mouth: Mucous membranes are moist.  Eyes:     Pupils: Pupils are equal, round, and reactive to light.  Cardiovascular:     Rate and Rhythm: Normal rate and regular rhythm.     Pulses: Normal pulses.     Heart sounds: Normal heart sounds.  Pulmonary:     Effort: Pulmonary effort is normal. No respiratory distress.     Breath sounds: Normal breath sounds. No stridor. No wheezing, rhonchi or rales.  Chest:     Chest wall: No tenderness.  Abdominal:     General: Abdomen is flat. There is no distension.     Palpations: Abdomen is soft.     Tenderness: There is abdominal tenderness in the right lower quadrant and suprapubic area. There is no right CVA tenderness, left CVA tenderness or guarding.     Comments: Suprapubic and right lower pelvic tenderness to palpation.  Genitourinary:    Vagina: Vaginal discharge present.     Cervix:  No cervical motion tenderness, friability, erythema or eversion.     Adnexa:        Right: Tenderness present.      Comments: Thick yellow-white vaginal discharge, otherwise unremarkable. Musculoskeletal:        General: Normal range of motion.     Cervical back: Normal range of motion and neck supple. No rigidity or tenderness.  Lymphadenopathy:     Cervical: No cervical adenopathy.  Skin:    General: Skin is warm.     Capillary Refill: Capillary refill takes less than 2 seconds.  Neurological:     General: No focal deficit present.     Mental Status: She is alert and oriented to person, place, and time.  Psychiatric:        Mood and Affect: Mood normal.        Behavior: Behavior normal.      Procedures   Procedures  ED Course & Medical Decision Making   Medical Decision Making: Medical  Decision Making Problems Addressed: Bacterial vaginosis: complicated acute illness or injury Pelvic pain: complicated acute illness or injury  Amount and/or Complexity of Data Reviewed Labs: ordered. Decision-making details documented in ED Course. Radiology: ordered. Decision-making details documented in ED Course.  Risk OTC drugs. Prescription drug management. Decision regarding hospitalization.     Additional history obtained by independent historian: nursing report   Relevant comorbidities and social factors potentially contributing to care include but not limited to asthma, MDD, s/p appendectomy.   Patients chart and external medical records reviewed.  Patient underwent appendectomy February 2025.  Postsurgical CT abdomen pelvis performed on 12/25/2023 revealed no acute intra-abdominal process or postsurgical complications.  No nephrolithiasis.  On arrival pt is well-appearing, nontoxic in no acute distress.  She is neurologically intact, no respiratory symptoms appreciated.  Evaluation noted as above she has right lower pelvic and suprapubic tenderness palpation otherwise abdomen examination is unremarkable.  No distention or diffuse peritonitis back complaint is unremarkable.  Pelvic examination with chaperone reveals thick white vaginal discharge, no abnormal lesions no CMT.  Does appear to have some right adnexal tenderness on exam.  Transvaginal ultrasound independently reviewed, confirmed by radiology to show no acute pelvic process, no evidence of torsion.  Hcg negative, no pregnancy.  Urinalysis negative for cystitis.  CBC CMP and lipase unremarkable.  Wet prep is positive for WBCs and clue cells.  Patient's vaginal discharge appears to be consistent with bacterial vaginosis.  She does endorse unprotected intercourse 6 days ago prior to the onset of her discomfort and has elected to move forward with additional STI prophylaxis including ceftriaxone and azithromycin in the ED  today.  Fortunately there are no physical exam findings to suggest pelvic inflammatory disease, afebrile no leukocytosis no abnormal ultrasound findings and no CMT on physical examination today.  Patient's pelvic discomfort is described as acute on chronic with many episodes of similar in the past, states that she is under evaluation of possible endometriosis.  I have low suspicion for emergent intra-abdominal unsure pelvic pathology at this time.  do not feel that patient requires additional evaluation with CT imaging although this was discussed and considered.  Patient is in agreement although she does feel like nausea limitations of my evaluation today.  Treated with Toradol for pain relief.  On reevaluation she describes significant improvement in her pain, now minimal.  Started on Flagyl and additional STI meds as above.  She continues to overall appear well does not appear systemically ill or septic and pain  is minimal.  Differential diagnoses considered but less likely today include PID, ovarian torsion, appendicitis, colitis, diverticulitis, SBP, bowel perforation, nephrolithiasis, cystitis, pyonephritis.  Do not feel that patient requires any further evaluation in the ED or hospitalization today. They are hemodynamically stable for discharge home. Symptomatic management thoroughly reviewed.   Patient was advised to follow-up promptly with PCP and OB/GYN.  ED return precautions thoroughly reviewed. Patient is in agreement with treatment plan.   ED Course: ED Course as of 03/25/24 1936  Sat Mar 25, 2024  1746 Human Chorionic Gonadotropin  (HCG), Qualitative, Urine:   Beta HCG Qualitative, Urine Negative [AS]  1746 Urinalysis with Reflex to Microscopic:   Color Yellow  Clarity, Urine Clear  Specific Gravity, Urine <=1.005  pH, Urine 6.5  Protein, Urine Negative  Glucose, Urine Negative  Ketones, Urine Negative  Bilirubin, Urine Negative  Blood, Urine Negative  Nitrite, Urine Negative   Leukocytes Esterase, Urine Negative  Urobilinogen, Urine 0.2 [AS]  1746 CBC with Differential(!):   WBC 6.62  Monocyte Distribution Width 16.0  RBC 3.89  Hemoglobin 11.3  Hematocrit 33(!)  Mean Corpuscular Volume (MCV) 86  MCH 29  Mean Corpuscular Hemoglobin Conc (MCHC) 34  Red Cell Distribution Width (RDW-CV) 14.9  Platelet Count (Plt) 294  Mean Platelet Volume (MPV) 9.0  Neutrophils % 48  Lymphocytes % 32  Monocytes % 17  Eosinophils % 2  Basophils % 1  nRBC % 0  Neutrophils Absolute 3.20  Lymphocytes Absolute 2.10  Monocytes Absolute 1.10(!)  Eosinophils Absolute 0.20  Basophils Absolute 0.00  nRBC Absolute 0.01 [AS]  1800 Comprehensive Metabolic Panel(!):   Sodium 138  Potassium 3.9  Chloride 104  CO2 23  Anion Gap 11  Glucose 73  Blood Urea Nitrogen 8  Creatinine 0.67  eGFR >90  Albumin 3.9  Total Protein 6.7  Bilirubin Total 0.6  Alk Phos Total 34(!)  Aspartate Aminotransferase (AST) 14  Alanine Aminotransferase 12  Calcium Level Total 9.0 [AS]  1800 Lipase:   Lipase 21 [AS]  1824 Wet Prep(!):   WBC Few WBC(!)  Clue Cells Present(!)  Trichomonas Absent  Yeast Absent [AS]  1911 US  Transvaginal With Doppler No acute pelvic process. Specifically, no sonographic evidence of ovarian torsion. [AS]    ED Course User Index [AS] Juliene Bent, PA    ED Meds: Medications  metroNIDAZOLE (FLAGYL) tablet 500 mg (has no administration in time range)  azithromycin (ZITHROMAX) tablet 1,000 mg (has no administration in time range)  cefTRIAXone (ROCEPHIN) 500 mg in lidocaine  PF (XYLOCAINE ) 10 mg/mL (1 %) IM syringe (has no administration in time range)  ketorolac (TORADOL) injection 15 mg (15 mg intravenous Given 03/25/24 1808)      DISPO DISCHARGE: Old records reviewed.  Labs, imaging, and EKG reviewed and by myself and imaging interpreted by radiology used in the MDM.  Addressed patient questions and concerns.  Reviewed discharge instructions with strict  precautions given.  Advised patient to schedule follow-up with primary care doctor.  Patient verbalized understanding and agrees with plan.  Patient stable at discharge.   Diagnosis & Disposition   Diagnosis: 1. Bacterial vaginosis   2. Pelvic pain      Disposition:  Discharge   Prescriptions: ED Prescriptions     Medication Sig Dispense Start Date End Date Auth. Provider   metroNIDAZOLE (FLAGYL) 500 mg tablet Take one tablet (500 mg total) by mouth 2 (two) times a day for 7 days. 14 tablet 03/25/2024 04/01/2024 Juliene Bent, PA   fluconazole  (Diflucan )  150 mg tablet Take one tablet (150 mg total) by mouth once for 1 dose. 1 tablet 03/26/2024 03/26/2024 Juliene Bent, PA         Past Contributory History   Allergies: House dust mite, Bee pollen, and Grass pollen   Personal History: Past Medical History:  Diagnosis Date  . Anxiety 06/2020  . Asthma (CMD)    Asthma  . Depression 06/2020  . Eczema   . Gonorrhea 2020  . Herpes 2022  . Lightheadedness    Lightheadedness  . Mild asthma (CMD)    Mild asthma  . Nausea    Nausea  . PID (pelvic inflammatory disease) 2022  . Trauma   . Urinary incontinence    URINARY INCONTINENCE, UNSPECIFIED  . Urinary tract infection without hematuria 08/21/2022    Past Surgical History:  Procedure Laterality Date  . APPENDECTOMY N/A 12/24/2023   APPENDECTOMY LAPAROSCOPIC performed by Kellie Irven Schmitt, MD at [facility] OR   Family History  Adopted: Yes  Problem Relation Name Age of Onset  . Diabetes Paternal Grandfather Darenda Fike Sr   . Hypertension Paternal Grandfather Shelita Steptoe Sr   . Diabetes Paternal Grandmother Ruby Ehinger   . Hyperlipidemia Paternal Grandmother [employer]   . Hypertension Paternal Grandmother [employer]   . Stroke Paternal Grandmother Ruby Aro   . Diabetes Father Carlin Wilhelmi        Adoptive father  . Hyperlipidemia Father Carlin Kaley   . Hypertension Father Carlin Rentfrow   . Kidney disease Father Carlin  Geng   . Breast cancer Neg Hx    . Colon cancer Neg Hx    . Ovarian cancer Neg Hx      Social History   Tobacco Use  . Smoking status: Never    Passive exposure: Never  . Smokeless tobacco: Never  Vaping Use  . Vaping status: Never Used  Substance Use Topics  . Alcohol use: Not Currently    Comment: occasional alcohol use, reports a few drinks per month  . Drug use: Yes    Types: Marijuana    Comment: Smoking    Home Medications: Current Outpatient Medications  Medication Instructions  . diphenhydrAMINE (BENADRYL) 25 mg, oral, 2 times daily  . [START ON 03/26/2024] fluconazole  (DIFLUCAN ) 150 mg, oral, Once  . loratadine (CLARITIN) 10 mg, Daily  . metroNIDAZOLE (FLAGYL) 500 mg, oral, 2 times daily     Results   Results: Labs Reviewed  COMPREHENSIVE METABOLIC PANEL - Abnormal      Result Value   Sodium 138     Potassium 3.9     Chloride 104     CO2 23     Anion Gap 11     Glucose, Random 73     Blood Urea Nitrogen (BUN) 8     Creatinine 0.67     eGFR >90     Albumin 3.9     Total Protein 6.7     Bilirubin, Total 0.6     Alkaline Phosphatase (ALP) 34 (*)    Aspartate Aminotransferase (AST) 14     Alanine Aminotransferase (ALT) 12     Calcium 9.0     BUN/Creatinine Ratio      CBC WITH DIFFERENTIAL - Abnormal   WBC 6.62     Monocyte Distribution Width 16.0     RBC 3.89     Hemoglobin 11.3     Hematocrit 33 (*)    Mean Corpuscular Volume (MCV) 86     Mean  Corpuscular Hemoglobin (MCH) 29     Mean Corpuscular Hemoglobin Conc (MCHC) 34     Red Cell Distribution Width (RDW) 14.9     Platelet Count (PLT) 294     Mean Platelet Volume (MPV) 9.0     Neutrophils % 48     Lymphocytes % 32     Monocytes % 17     Eosinophils % 2     Basophils % 1     nRBC % 0     Neutrophils Absolute 3.20     Lymphocytes # 2.10     Monocytes # 1.10 (*)    Eosinophils # 0.20     Basophils # 0.00     nRBC Absolute 0.01    WET PREP - Abnormal   WBC, Wet Prep Few WBC (*)     Clue Cells, Wet Prep Present (*)    Trichomonas, Wet Prep Absent     Yeast, Wet Prep Absent    URINALYSIS WITH REFLEX TO MICROSCOPIC - Normal   Color, Urine Yellow     Clarity, Urine Clear     Specific Gravity, Urine <=1.005     pH, Urine 6.5     Protein, Urine Negative     Glucose, Urine Negative     Ketones, Urine Negative     Bilirubin, Urine Negative     Blood, Urine Negative     Nitrite, Urine Negative     Leukocyte Esterase, Urine Negative     Urobilinogen, Urine 0.2    HUMAN CHORIONIC GONADOTROPIN  (HCG), QUALITATIVE, URINE - Normal   Beta HCG Qualitative, Urine Negative    LIPASE - Normal   Lipase 21    CHLAMYDIA / GONOCOCCUS (GC), NAAT    Imaging: US  Transvaginal With Doppler  Final Result by Ozell JULIANNA Halter, DO (05/10 1858)  DATE OF SERVICE:  03/25/2024 6:49 pm    EXAM:  US  TRANSVAGINAL WITH DOPPLER    CLINICAL HISTORY:  pelvic pain, worst over right ovary with vaginal discharge    COMPARISON:  CT ABDOMEN PELVIS W CONTRAST, ACC: [id number], dated 2023-12-25   08:40:09    TECHNIQUE:   Transvaginal ultrasound Real-time Duplex scan was performed using   B-Mode/gray scale imaging, Doppler spectral analysis, and color flow   Doppler imaging.    FINDINGS:  UTERUS:  Size: 6 x 4.6 x 4.3 cm.  Endometrial Stripe: 6 mm.  Uterus Comments: Heterogeneous uterine fibroid measures 1.3 x 1 x 1.1 cm.    RIGHT OVARY:  Size: 3 x 1.1 x 1.6 cm.  Right Ovary Comments: Normal echotexture. No focal lesion. Arterial and   venous Doppler waveforms visualized on duplex imaging.    LEFT OVARY:  Size: 2 x 1.2 x 2 cm.  Left Ovary Comments: Normal echotexture. No focal lesion. Arterial and   venous Doppler waveforms visualized on duplex imaging.    Cul-[place]-Sac: No free fluid.    IMPRESSION:  No acute pelvic process. Specifically, no sonographic evidence of ovarian   torsion.    THIS IS AN ELECTRONICALLY VERIFIED FINAL REPORT  03/25/2024 6:58 PM - Electronically signed by  Ozell Halter   Workstation: [id number]  [facility]       EKG: Encounter Date: 01/28/24  ECG 12 lead   Narrative                    [facility]  Test Date     2024-01-28 [facility] Name      [facility] Coach              Department    PINESCED Patient ID    [id number]               Room          YT97 Gender        Female                   Technician    Sq DOB           [birth year]               Requested By  VICTORY OVERLY Order Number  [id number]                Reading MD    Liza Roca                                  Measurements Intervals                              Axis           Rate          95                       P             36 PR            201                      QRS           36 QRSD          73                       T             48 QT            345                                     QTc           398                                                                Interpretive Statements SINUS RHYTHM WITH RATE OF 95 NORMAL AXIS NO STEMI I have personally reviewed the tracing and my findings are listed above Liza Roca on 01-28-2024 21 33 11 EDT    Clinical Decision Support:   CHA2DS2-VASc Score: N/A     Glasgow Coma Scale Score: 15                     Juliene Bent, PA-C [facility]  Department of Emergency Medicine   Attending Attestation: ED Attestation

## 2024-05-16 NOTE — ED Provider Notes (Signed)
 ------------------------------------------------------------------------------- Attestation signed by Armida MARLA Dawn, MD at 05/17/2024 10:00 AM ========================================================== ATTENDING Supervision Note and Attestation: The patient was presented to me by the advanced practice provider.  I have independently seen and evaluated the patient at bedside and spoke with family, if available.  I agree with the history, physical, medical decision making and plan as outline above by the APP.   The above dictation was created using Paediatric nurse. Although some clinical editing has occurred, minor errors may still exist. Please contact me if you have any questions.  <Electronically signed, Armida Dawn, MD >  ========================================================== -------------------------------------------------------------------------------  [facility] Emergency Department Provider Note  Chief Complaint: Foot Problem (C/o left foot pain/ swelling, tingling sensation. Denies trauma. Hx: Bronchial Asthma, MDD.)   Time Seen     Event Date/Time   First Provider Evaluation 05/16/24 1712            History of Present Illness   Danielle English is a 23 y.o. female with a past medical history of asthma, MDD who presents to the emergency department for evaluation of foot pain.  She states for the last month she has been having pain in her left foot.  She states has gotten worse over the last 2 weeks and is usually in the morning when she wakes up she describes it as a tingling sensation in her toes and sometimes will have pain along the medial arch.  She also noticed that one of the veins were bulging along her medial arch but states that is not happening at current.  She also endorses discoloration around the medial malleolus, but denies any redness, warmth or swelling.  Denies any fevers or chills.  Ambulating without difficulty. No injury or  trauma.  HPI HPI    Review of Systems   Review of Systems   Physical Exam   Initial Vital Signs for the past 24 hrs (Last 1 readings):  BP Temp Temp src Pulse Resp SpO2 Weight  05/16/24 1649 107/64 98.6 F (37 C) Oral 84 18 99 % 73 kg (160 lb 15 oz)   Physical Exam  ED Triage Vitals [05/16/24 1649]  Temp 98.6 F (37 C)  Heart Rate 84  Resp 18  BP 107/64  MAP (mmHg) 75  SpO2 99 %  O2 Device   O2 Flow Rate (L/min)   Weight 73 kg (160 lb 15 oz)   Physical Exam  General: Alert, no acute distress. Head: Normocephalic, atraumatic. Eye: no sclera icterus  ENT: Supple neck, trachea midline. Mucous membranes moist.  Respiratory: Symmetric chest rise. MSK: No gross deformities.  2+ DP and PT pulses of the left foot.  Able to flex and extend at the ankle.  Able to wiggle all toes.  No erythema, edema or warmth anywhere to the left ankle.  No bulging veins.  No tenderness to palpation.  No tenderness along the plantar fascial plane Skin: Warm, dry Psychiatric: Cooperative, appropriate mood & affect. Neuro: No AMS, oriented.   Procedures   Procedures  ED Course & Medical Decision Making   Medical Decision Making: Medical Decision Making Problems Addressed: Left foot pain: complicated acute illness or injury with systemic symptoms  Amount and/or Complexity of Data Reviewed Radiology: ordered.  Risk OTC drugs.  23 year old female presenting for evaluation of a left foot pain that has been ongoing for the last month, worse over the last 2 weeks.  On initial evaluation, the patient is hemodynamically stable in no acute distress.  She  has intact range of motion of all joints of the left lower extremity, is warm and well-perfused with 2+ pulses, do not suspect acute limb ischemia.  She has no tenderness, edema in the calf and do not suspect acute DVT.  No tenderness along the plantar fascia plain but did discuss possibility of plantar fasciitis given her pain is usually along the  medial arch.  No signs of gout appreciated.  No erythema or warmth or tenderness with range of motion, do not suspect acute septic joint.  Given no injury or trauma, low suspicion for fracture, but will obtain x-ray of the foot.  ED Course: ED Course as of 05/16/24 1741  Tue May 16, 2024  1741 Prior to the patient receiving x-rays and completing her treatment, she walked out of the emergency department and nursing staff notified me. [MS]    ED Course User Index [MS] Selinda Larri Sharps, [place]    ED Meds: Medications  acetaminophen (TYLENOL) tablet 975 mg (has no administration in time range)     Diagnosis & Disposition   Diagnosis: 1. Left foot pain      Disposition:  Left Before Treatment Complete   Prescriptions: ED Prescriptions   None       Past Contributory History   Allergies: House dust mite, Bee pollen, and Grass pollen   Personal History: Medical History[1]  Surgical History[2] Family History[3]  Social History[4]  Home Medications: Current Outpatient Medications  Medication Instructions  . diphenhydrAMINE (BENADRYL) 25 mg, oral, 2 times daily  . loratadine (CLARITIN) 10 mg, Daily  . metroNIDAZOLE (FLAGYL) 500 mg, oral, 2 times daily     Results   Results: Labs Reviewed  HIV SCREEN WITH REFLEX TO CONFIRMATION (ORDERABLE)   Narrative:    The following orders were created for panel order HIV Screen with Reflex to Confirmation. Procedure                               Abnormality         Status                    ---------                               -----------         ------                    HIV Screen with Reflex .SABRASABRA[[id number]]                                                Stoney Top Hold Ulaz[[id number]]                                                        Please view results for these tests on the individual orders.  HIV SCREEN WITH REFLEX TO CONFIRMATION (PERFORMABLE)  PEARL TOP HOLD TUBE    Imaging: No orders to display      EKG: Encounter Date: 01/28/24  ECG 12 lead   Narrative  [facility]                                                                                Test Date     2024-01-28 [facility] Name      [facility]              Department    PINESCED Patient ID    [id number]               Room          YT97 Gender        Female                   Technician    Sq DOB           [birth year]               Requested By  VICTORY OVERLY Order Number  [id number]                Reading MD    Liza Roca                                  Measurements Intervals                              Axis           Rate          95                       P             36 PR            201                      QRS           36 QRSD          73                       T             48 QT            345                                     QTc           398                                                                Interpretive Statements SINUS RHYTHM WITH RATE OF 95 NORMAL AXIS NO STEMI I have personally reviewed the tracing and my findings are  listed above Liza Roca on 01-28-2024 21 33 11 EDT    Clinical Decision Support:   CHA2DS2-VASc Score: N/A     Glasgow Coma Scale Score: 15                     Selinda Sharps, PA-C [facility],  Department of Emergency Medicine   Attending Attestation: ED Attestation Portions of this note have been dictated using Dragon voice to text software. Errors may be present despite proofreading.        [1] Past Medical History: Diagnosis Date  . Anxiety 06/2020  . Asthma (CMD)    Asthma  . Depression 06/2020  . Eczema   . Gonorrhea 2020  . Herpes 2022  . Lightheadedness    Lightheadedness  . Mild asthma (CMD)    Mild asthma  . Nausea    Nausea  . PID (pelvic inflammatory disease) 2022  . Trauma   . Urinary incontinence    URINARY INCONTINENCE, UNSPECIFIED  . Urinary tract infection  without hematuria 08/21/2022  [2] Past Surgical History: Procedure Laterality Date  . APPENDECTOMY N/A 12/24/2023   APPENDECTOMY LAPAROSCOPIC performed by Kellie Irven Schmitt, MD at [facility] OR  [3] Family History Adopted: Yes  Problem Relation Name Age of Onset  . Diabetes Paternal Grandfather Eulogia Dismore Sr   . Hypertension Paternal Grandfather Zarinah Oviatt Sr   . Diabetes Paternal Grandmother Ruby Rounds   . Hyperlipidemia Paternal Grandmother [employer]   . Hypertension Paternal Grandmother [employer]   . Stroke Paternal Grandmother Ruby Kittrell   . Diabetes Father Carlin Teschner        Adoptive father  . Hyperlipidemia Father Carlin Dinger   . Hypertension Father Carlin Hopson   . Kidney disease Father Carlin Vida   . Breast cancer Neg Hx    . Colon cancer Neg Hx    . Ovarian cancer Neg Hx    [4] Social History Tobacco Use  . Smoking status: Never    Passive exposure: Never  . Smokeless tobacco: Never  Vaping Use  . Vaping status: Never Used  Substance Use Topics  . Alcohol use: Not Currently    Comment: occasional alcohol use, reports a few drinks per month  . Drug use: Yes    Types: Marijuana    Comment: Smoking

## 2024-07-21 ENCOUNTER — Encounter (HOSPITAL_BASED_OUTPATIENT_CLINIC_OR_DEPARTMENT_OTHER): Payer: Self-pay | Admitting: Urology

## 2024-07-21 ENCOUNTER — Emergency Department (HOSPITAL_BASED_OUTPATIENT_CLINIC_OR_DEPARTMENT_OTHER)

## 2024-07-21 ENCOUNTER — Other Ambulatory Visit (HOSPITAL_BASED_OUTPATIENT_CLINIC_OR_DEPARTMENT_OTHER): Payer: Self-pay

## 2024-07-21 ENCOUNTER — Emergency Department (HOSPITAL_BASED_OUTPATIENT_CLINIC_OR_DEPARTMENT_OTHER)
Admission: EM | Admit: 2024-07-21 | Discharge: 2024-07-21 | Disposition: A | Discharge status: Home / Self Care | Attending: Emergency Medicine | Admitting: Emergency Medicine

## 2024-07-21 DIAGNOSIS — R059 Cough, unspecified: Secondary | ICD-10-CM | POA: Diagnosis present

## 2024-07-21 DIAGNOSIS — U071 COVID-19: Secondary | ICD-10-CM | POA: Diagnosis not present

## 2024-07-21 DIAGNOSIS — J45909 Unspecified asthma, uncomplicated: Secondary | ICD-10-CM | POA: Diagnosis not present

## 2024-07-21 LAB — RESP PANEL BY RT-PCR (RSV, FLU A&B, COVID)  RVPGX2
Influenza A by PCR: NEGATIVE
Influenza B by PCR: NEGATIVE
Resp Syncytial Virus by PCR: NEGATIVE
SARS Coronavirus 2 by RT PCR: POSITIVE — AB

## 2024-07-21 MED ORDER — FLUTICASONE PROPIONATE 50 MCG/ACT NA SUSP
2.0000 | Freq: Every day | NASAL | 2 refills | Status: AC
Start: 2024-07-21 — End: ?
  Filled 2024-07-21: qty 16, 30d supply, fill #0

## 2024-07-21 MED ORDER — ALBUTEROL SULFATE HFA 108 (90 BASE) MCG/ACT IN AERS
1.0000 | INHALATION_SPRAY | Freq: Four times a day (QID) | RESPIRATORY_TRACT | 0 refills | Status: AC | PRN
  Filled 2024-07-21: qty 6.7, 19d supply, fill #0

## 2024-07-21 NOTE — ED Provider Notes (Signed)
 [place] [facility] Provider Note   CSN: [id number] Arrival date & time: 07/21/24  1112     Patient presents with: Covid Positive and Cough   Danielle English is a 23 y.o. female presents to the ED today with concerns for increased shortness of breath and productive cough.  She did a home test approximately 6 days ago and was positive for COVID, had been on a cruise prior to beginning of symptoms.  As noted she does have a history of asthma.  Denies any hemoptysis.  States that her breathing difficulties are increased at night, and at daytime she noticed decrease nasal secretions however she does have nasal congestion and does endorse some sore throat.  Already an oral solid any liquid intake well.    Cough Associated symptoms: rhinorrhea        Prior to Admission medications   Medication Sig Start Date End Date Taking? Authorizing Provider  albuterol  (VENTOLIN  HFA) 108 (90 Base) MCG/ACT inhaler Inhale 1-2 puffs into the lungs every 6 (six) hours as needed for wheezing or shortness of breath. 07/21/24  Yes Myriam Dorn BROCKS, PA  fluticasone  (FLONASE ) 50 MCG/ACT nasal spray Place 2 sprays into both nostrils daily. 07/21/24  Yes Myriam Dorn BROCKS, PA  albuterol  (VENTOLIN  HFA) 108 (90 Base) MCG/ACT inhaler Inhale 2 puffs into the lungs every 4 (four) hours as needed for wheezing.  04/26/17   [provider]  JUNEL FE 1/20 1-20 MG-MCG tablet Take 1 tablet by mouth daily. 07/21/18   [provider]  mometasone (NASONEX) 50 MCG/ACT nasal spray Place 2 sprays into the nose daily.    [provider]  mometasone-formoterol (DULERA) 100-5 MCG/ACT AERO Inhale 2 puffs into the lungs 2 (two) times daily. 03/01/18   [provider]  montelukast (SINGULAIR) 10 MG tablet Take 10 mg by mouth every evening. 03/11/17   [provider]  ondansetron  (ZOFRAN -ODT) 4 MG disintegrating tablet Take 1 tablet (4 mg total) by mouth every 8  (eight) hours as needed for nausea or vomiting. 02/02/20   Patsey Lot, MD  QVAR REDIHALER 80 MCG/ACT inhaler Inhale 1-2 puffs into the lungs 2 (two) times daily.  07/30/18   [provider]  sertraline (ZOLOFT) 50 MG tablet Take 50 mg by mouth daily.    [provider]    Allergies: Patient has no known allergies.    Review of Systems  HENT:  Positive for congestion, postnasal drip and rhinorrhea.   Respiratory:  Positive for cough.   All other systems reviewed and are negative.   Updated Vital Signs BP (!) 155/100   Pulse 90   Temp 97.6 F (36.4 C)   Resp 20   Ht 5' 3.5 (1.613 m)   Wt 67.1 kg   SpO2 100%   BMI 25.79 kg/m   Physical Exam Vitals and nursing note reviewed.  Constitutional:      General: She is not in acute distress.    Appearance: She is well-developed.  HENT:     Head: Normocephalic and atraumatic.     Mouth/Throat:     Lips: Pink.     Mouth: Mucous membranes are moist.     Pharynx: Oropharynx is clear. Uvula midline. Posterior oropharyngeal erythema present.  Eyes:     Conjunctiva/sclera: Conjunctivae normal.  Cardiovascular:     Rate and Rhythm: Normal rate and regular rhythm.     Heart sounds: No murmur heard. Pulmonary:     Effort: Pulmonary effort  is normal. No respiratory distress.     Breath sounds: Normal breath sounds.  Abdominal:     Palpations: Abdomen is soft.     Tenderness: There is no abdominal tenderness.  Musculoskeletal:        General: No swelling.     Cervical back: Neck supple.  Skin:    General: Skin is warm and dry.     Capillary Refill: Capillary refill takes less than 2 seconds.  Neurological:     Mental Status: She is alert.  Psychiatric:        Mood and Affect: Mood normal.     (all labs ordered are listed, but only abnormal results are displayed) Labs Reviewed  RESP PANEL BY RT-PCR (RSV, FLU A&B, COVID)  RVPGX2 - Abnormal; Notable for the following components:      Result Value   SARS  Coronavirus 2 by RT PCR POSITIVE (*)    All other components within normal limits    EKG: None  Radiology: DG Chest 2 View Result Date: 07/21/2024 CLINICAL DATA:  Cough. EXAM: CHEST - 2 VIEW COMPARISON:  09/02/2020. FINDINGS: The heart size and mediastinal contours are within normal limits. Both lungs are clear. The visualized skeletal structures are unremarkable. IMPRESSION: No acute cardiopulmonary findings. Electronically Signed   By: Harrietta Sherry M.D.   On: 07/21/2024 12:47     Procedures   Medications Ordered in the ED - No data to display                                  Medical Decision Making Amount and/or Complexity of Data Reviewed Radiology: ordered.   In line with her symptoms, obtained COVID/flu/RSV swab which did show positive for SARS-CoV-2.  Further, obtain chest x-ray secondary to asthma history and shortness of breath.  Chest x-ray did not show any acute abnormalities, did not show any signs of pneumonia, and pulmonary auscultation is unremarkable.  As such, given the positive COVID result, believe her current symptoms are secondary to acute COVID infection.  I discussion with patient regarding Paxlovid treatment, she declines this at this time, and will continue management with over-the-counter medications, also provide her with prescriptions for her chronic albuterol  rescue inhaler as well as fluticasone  nasal spray to help manage nasal secretions.  She understands and agrees, and will follow-up with primary care over the next 1 to 2 weeks.  As she has maintained vital signs within normal limits, has no concerning signs or symptoms, no concerning findings on workup, but she is stable for discharge and outpatient management at this time.     Final diagnoses:  COVID    ED Discharge Orders          Ordered    fluticasone  (FLONASE ) 50 MCG/ACT nasal spray  Daily        07/21/24 1447    albuterol  (VENTOLIN  HFA) 108 (90 Base) MCG/ACT inhaler  Every 6 hours PRN         07/21/24 1447               Myriam Dorn BROCKS, [place] 07/21/24 1448    Tegeler, Lonni PARAS, MD 07/21/24 [phone]

## 2024-07-21 NOTE — Discharge Instructions (Signed)
 As discussed, please follow-up with your primary care provider in the next week to 2 weeks as needed, and follow back up here to the emergency department should you have any increase in shortness of breath or chest discomfort.

## 2024-07-21 NOTE — ED Notes (Signed)
 Patient transported to X-ray

## 2024-07-21 NOTE — ED Triage Notes (Signed)
 Pt states was +COVID at home on Saturday after a cruise  States now has SOB worsening  States cough is productive   H/o asthma

## 2024-07-31 ENCOUNTER — Other Ambulatory Visit (HOSPITAL_BASED_OUTPATIENT_CLINIC_OR_DEPARTMENT_OTHER): Payer: Self-pay
